# Patient Record
Sex: Female | Born: 1955 | Race: White | Hispanic: No | Marital: Single | Smoking: Never smoker
Health system: Southern US, Community
[De-identification: ages and names within clinical notes are randomized; demographics above are authoritative.]

## PROBLEM LIST (undated history)

## (undated) DIAGNOSIS — R5382 Chronic fatigue, unspecified: Secondary | ICD-10-CM

## (undated) DIAGNOSIS — G43019 Migraine without aura, intractable, without status migrainosus: Secondary | ICD-10-CM

## (undated) DIAGNOSIS — Z803 Family history of malignant neoplasm of breast: Secondary | ICD-10-CM

## (undated) DIAGNOSIS — I10 Essential (primary) hypertension: Secondary | ICD-10-CM

## (undated) DIAGNOSIS — G9332 Myalgic encephalomyelitis/chronic fatigue syndrome: Secondary | ICD-10-CM

## (undated) DIAGNOSIS — Z9889 Other specified postprocedural states: Secondary | ICD-10-CM

## (undated) DIAGNOSIS — M5430 Sciatica, unspecified side: Secondary | ICD-10-CM

## (undated) DIAGNOSIS — M810 Age-related osteoporosis without current pathological fracture: Secondary | ICD-10-CM

## (undated) DIAGNOSIS — K219 Gastro-esophageal reflux disease without esophagitis: Secondary | ICD-10-CM

## (undated) DIAGNOSIS — N281 Cyst of kidney, acquired: Secondary | ICD-10-CM

## (undated) DIAGNOSIS — F332 Major depressive disorder, recurrent severe without psychotic features: Secondary | ICD-10-CM

## (undated) DIAGNOSIS — G43909 Migraine, unspecified, not intractable, without status migrainosus: Secondary | ICD-10-CM

## (undated) DIAGNOSIS — F32A Depression, unspecified: Secondary | ICD-10-CM

## (undated) DIAGNOSIS — K5790 Diverticulosis of intestine, part unspecified, without perforation or abscess without bleeding: Secondary | ICD-10-CM

## (undated) DIAGNOSIS — C44310 Basal cell carcinoma of skin of unspecified parts of face: Secondary | ICD-10-CM

## (undated) DIAGNOSIS — F419 Anxiety disorder, unspecified: Secondary | ICD-10-CM

## (undated) DIAGNOSIS — K8020 Calculus of gallbladder without cholecystitis without obstruction: Secondary | ICD-10-CM

## (undated) DIAGNOSIS — M797 Fibromyalgia: Secondary | ICD-10-CM

## (undated) DIAGNOSIS — R319 Hematuria, unspecified: Secondary | ICD-10-CM

## (undated) DIAGNOSIS — R922 Inconclusive mammogram: Secondary | ICD-10-CM

## (undated) DIAGNOSIS — F411 Generalized anxiety disorder: Secondary | ICD-10-CM

## (undated) DIAGNOSIS — F329 Major depressive disorder, single episode, unspecified: Secondary | ICD-10-CM

## (undated) DIAGNOSIS — R000 Tachycardia, unspecified: Secondary | ICD-10-CM

## (undated) DIAGNOSIS — F5104 Psychophysiologic insomnia: Secondary | ICD-10-CM

## (undated) HISTORY — DX: Hematuria, unspecified: R31.9

## (undated) HISTORY — DX: Calculus of gallbladder without cholecystitis without obstruction: K80.20

## (undated) HISTORY — DX: Migraine without aura, intractable, without status migrainosus: G43.019

## (undated) HISTORY — DX: Basal cell carcinoma of skin of unspecified parts of face: C44.310

## (undated) HISTORY — DX: Tachycardia, unspecified: R00.0

## (undated) HISTORY — DX: Anxiety disorder, unspecified: F41.9

## (undated) HISTORY — DX: Myalgic encephalomyelitis/chronic fatigue syndrome: G93.32

## (undated) HISTORY — DX: Major depressive disorder, single episode, unspecified: F32.9

## (undated) HISTORY — DX: Chronic fatigue, unspecified: R53.82

## (undated) HISTORY — DX: Cyst of kidney, acquired: N28.1

## (undated) HISTORY — DX: Psychophysiologic insomnia: F51.04

## (undated) HISTORY — DX: Depression, unspecified: F32.A

## (undated) HISTORY — DX: Sciatica, unspecified side: M54.30

## (undated) HISTORY — DX: Migraine, unspecified, not intractable, without status migrainosus: G43.909

## (undated) HISTORY — DX: Fibromyalgia: M79.7

---
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
## 1998-02-28 ENCOUNTER — Other Ambulatory Visit: Admission: RE | Admit: 1998-02-28 | Discharge: 1998-02-28 | Payer: Self-pay | Admitting: Obstetrics and Gynecology

## 1999-03-01 ENCOUNTER — Encounter: Payer: Self-pay | Admitting: Family Medicine

## 1999-03-01 ENCOUNTER — Encounter: Admission: RE | Admit: 1999-03-01 | Discharge: 1999-03-01 | Payer: Self-pay | Admitting: Family Medicine

## 1999-05-03 ENCOUNTER — Other Ambulatory Visit: Admission: RE | Admit: 1999-05-03 | Discharge: 1999-05-03 | Payer: Self-pay | Admitting: Obstetrics and Gynecology

## 1999-08-10 ENCOUNTER — Encounter: Payer: Self-pay | Admitting: *Deleted

## 1999-08-10 ENCOUNTER — Encounter: Admission: RE | Admit: 1999-08-10 | Discharge: 1999-08-10 | Payer: Self-pay | Admitting: *Deleted

## 2000-06-10 ENCOUNTER — Other Ambulatory Visit: Admission: RE | Admit: 2000-06-10 | Discharge: 2000-06-10 | Payer: Self-pay | Admitting: Obstetrics and Gynecology

## 2000-09-12 ENCOUNTER — Encounter: Admission: RE | Admit: 2000-09-12 | Discharge: 2000-09-12 | Payer: Self-pay | Admitting: *Deleted

## 2000-09-12 ENCOUNTER — Encounter: Payer: Self-pay | Admitting: *Deleted

## 2000-09-17 ENCOUNTER — Encounter: Payer: Self-pay | Admitting: *Deleted

## 2000-09-17 ENCOUNTER — Encounter: Admission: RE | Admit: 2000-09-17 | Discharge: 2000-09-17 | Payer: Self-pay | Admitting: *Deleted

## 2001-06-11 ENCOUNTER — Other Ambulatory Visit: Admission: RE | Admit: 2001-06-11 | Discharge: 2001-06-11 | Payer: Self-pay | Admitting: *Deleted

## 2001-09-18 ENCOUNTER — Encounter: Admission: RE | Admit: 2001-09-18 | Discharge: 2001-09-18 | Payer: Self-pay | Admitting: *Deleted

## 2001-09-18 ENCOUNTER — Encounter: Payer: Self-pay | Admitting: *Deleted

## 2002-06-17 ENCOUNTER — Other Ambulatory Visit: Admission: RE | Admit: 2002-06-17 | Discharge: 2002-06-17 | Payer: Self-pay | Admitting: *Deleted

## 2002-09-21 ENCOUNTER — Encounter: Payer: Self-pay | Admitting: *Deleted

## 2002-09-21 ENCOUNTER — Encounter: Admission: RE | Admit: 2002-09-21 | Discharge: 2002-09-21 | Payer: Self-pay | Admitting: *Deleted

## 2003-05-15 ENCOUNTER — Encounter: Admission: RE | Admit: 2003-05-15 | Discharge: 2003-05-15 | Payer: Self-pay | Admitting: Orthopedic Surgery

## 2003-06-18 ENCOUNTER — Other Ambulatory Visit: Admission: RE | Admit: 2003-06-18 | Discharge: 2003-06-18 | Payer: Self-pay | Admitting: *Deleted

## 2003-06-18 ENCOUNTER — Encounter: Admission: RE | Admit: 2003-06-18 | Discharge: 2003-06-18 | Payer: Self-pay | Admitting: *Deleted

## 2003-07-31 ENCOUNTER — Encounter: Admission: RE | Admit: 2003-07-31 | Discharge: 2003-07-31 | Payer: Self-pay | Admitting: Neurology

## 2003-12-22 ENCOUNTER — Ambulatory Visit: Payer: Self-pay | Admitting: Internal Medicine

## 2004-06-20 ENCOUNTER — Other Ambulatory Visit: Admission: RE | Admit: 2004-06-20 | Discharge: 2004-06-20 | Payer: Self-pay | Admitting: *Deleted

## 2004-07-04 ENCOUNTER — Encounter: Admission: RE | Admit: 2004-07-04 | Discharge: 2004-07-04 | Payer: Self-pay | Admitting: *Deleted

## 2004-07-19 ENCOUNTER — Ambulatory Visit (HOSPITAL_COMMUNITY): Admission: RE | Admit: 2004-07-19 | Discharge: 2004-07-19 | Payer: Self-pay | Admitting: Family Medicine

## 2005-07-02 ENCOUNTER — Encounter: Admission: RE | Admit: 2005-07-02 | Discharge: 2005-07-02 | Payer: Self-pay | Admitting: Family Medicine

## 2005-07-05 ENCOUNTER — Encounter: Admission: RE | Admit: 2005-07-05 | Discharge: 2005-07-05 | Payer: Self-pay | Admitting: Obstetrics & Gynecology

## 2005-07-10 ENCOUNTER — Other Ambulatory Visit: Admission: RE | Admit: 2005-07-10 | Discharge: 2005-07-10 | Payer: Self-pay | Admitting: Obstetrics & Gynecology

## 2005-10-11 ENCOUNTER — Ambulatory Visit (HOSPITAL_COMMUNITY): Admission: RE | Admit: 2005-10-11 | Discharge: 2005-10-11 | Payer: Self-pay | Admitting: Obstetrics & Gynecology

## 2006-07-08 ENCOUNTER — Encounter: Admission: RE | Admit: 2006-07-08 | Discharge: 2006-07-08 | Payer: Self-pay | Admitting: Obstetrics & Gynecology

## 2006-07-26 ENCOUNTER — Other Ambulatory Visit: Admission: RE | Admit: 2006-07-26 | Discharge: 2006-07-26 | Payer: Self-pay | Admitting: Obstetrics & Gynecology

## 2006-12-02 HISTORY — PX: MOHS SURGERY: SUR867

## 2007-07-10 ENCOUNTER — Encounter: Admission: RE | Admit: 2007-07-10 | Discharge: 2007-07-10 | Payer: Self-pay | Admitting: Obstetrics & Gynecology

## 2007-07-31 ENCOUNTER — Other Ambulatory Visit: Admission: RE | Admit: 2007-07-31 | Discharge: 2007-07-31 | Payer: Self-pay | Admitting: Obstetrics & Gynecology

## 2008-08-05 ENCOUNTER — Other Ambulatory Visit: Admission: RE | Admit: 2008-08-05 | Discharge: 2008-08-05 | Payer: Self-pay | Admitting: Obstetrics & Gynecology

## 2009-06-22 ENCOUNTER — Encounter: Admission: RE | Admit: 2009-06-22 | Discharge: 2009-06-22 | Payer: Self-pay | Admitting: Family Medicine

## 2010-04-23 ENCOUNTER — Encounter: Payer: Self-pay | Admitting: Obstetrics & Gynecology

## 2011-06-07 ENCOUNTER — Other Ambulatory Visit: Payer: Self-pay | Admitting: Internal Medicine

## 2011-06-07 DIAGNOSIS — K921 Melena: Secondary | ICD-10-CM

## 2011-06-08 ENCOUNTER — Ambulatory Visit
Admission: RE | Admit: 2011-06-08 | Discharge: 2011-06-08 | Disposition: A | Discharge status: Home / Self Care | Payer: Medicare Other | Source: Ambulatory Visit | Attending: Internal Medicine | Admitting: Internal Medicine

## 2011-06-08 DIAGNOSIS — K921 Melena: Secondary | ICD-10-CM

## 2011-06-08 MED ORDER — IOHEXOL 300 MG/ML  SOLN
100.0000 mL | Freq: Once | INTRAMUSCULAR | Status: AC | PRN
  Administered 2011-06-08: 100 mL via INTRAVENOUS

## 2011-08-22 LAB — HM COLONOSCOPY

## 2011-09-04 LAB — HM PAP SMEAR: HM Pap smear: NEGATIVE

## 2012-01-29 LAB — HM DEXA SCAN

## 2012-07-30 ENCOUNTER — Telehealth: Payer: Self-pay | Admitting: *Deleted

## 2012-07-30 NOTE — Telephone Encounter (Signed)
Please sign order form for patient from [facility] for [place]ia[place]. Mommogram . Chart in your cabinet. sue

## 2012-08-15 ENCOUNTER — Telehealth: Payer: Self-pay | Admitting: *Deleted

## 2012-08-15 NOTE — Telephone Encounter (Signed)
SEE CHART IN YOUR OFFICE. MAMMOGRAM REPORT FOR 08/06/2012

## 2012-08-18 NOTE — Telephone Encounter (Signed)
Chart out of hold per Dr. Hyacinth Meeker. 04/ 35month recall

## 2012-09-26 ENCOUNTER — Encounter: Payer: Self-pay | Admitting: Obstetrics & Gynecology

## 2012-10-01 ENCOUNTER — Telehealth: Payer: Self-pay | Admitting: *Deleted

## 2012-10-01 ENCOUNTER — Ambulatory Visit (INDEPENDENT_AMBULATORY_CARE_PROVIDER_SITE_OTHER): Payer: Medicare Other | Admitting: Obstetrics & Gynecology

## 2012-10-01 ENCOUNTER — Ambulatory Visit: Payer: Self-pay | Admitting: Obstetrics & Gynecology

## 2012-10-01 ENCOUNTER — Encounter: Payer: Self-pay | Admitting: Obstetrics & Gynecology

## 2012-10-01 VITALS — BP 102/68 | HR 68 | Resp 16 | Ht 64.75 in | Wt 176.0 lb

## 2012-10-01 DIAGNOSIS — Z124 Encounter for screening for malignant neoplasm of cervix: Secondary | ICD-10-CM

## 2012-10-01 DIAGNOSIS — Z01419 Encounter for gynecological examination (general) (routine) without abnormal findings: Secondary | ICD-10-CM

## 2012-10-01 DIAGNOSIS — N9489 Other specified conditions associated with female genital organs and menstrual cycle: Secondary | ICD-10-CM

## 2012-10-01 DIAGNOSIS — Z0000 Encounter for general adult medical examination without abnormal findings: Secondary | ICD-10-CM

## 2012-10-01 LAB — COMPREHENSIVE METABOLIC PANEL
ALT: 15 U/L (ref 0–35)
AST: 23 U/L (ref 0–37)
Albumin: 5 g/dL (ref 3.5–5.2)
Alkaline Phosphatase: 58 U/L (ref 39–117)
BUN: 22 mg/dL (ref 6–23)
CO2: 25 mEq/L (ref 19–32)
Calcium: 9.8 mg/dL (ref 8.4–10.5)
Chloride: 99 mEq/L (ref 96–112)
Creat: 1.42 mg/dL — ABNORMAL HIGH (ref 0.50–1.10)
Glucose, Bld: 59 mg/dL — ABNORMAL LOW (ref 70–99)
Potassium: 4.4 mEq/L (ref 3.5–5.3)
Sodium: 137 mEq/L (ref 135–145)
Total Bilirubin: 0.4 mg/dL (ref 0.3–1.2)
Total Protein: 7.8 g/dL (ref 6.0–8.3)

## 2012-10-01 LAB — POCT URINALYSIS DIPSTICK
Bilirubin, UA: NEGATIVE
Blood, UA: NEGATIVE
Glucose, UA: NEGATIVE
Ketones, UA: NEGATIVE
Leukocytes, UA: NEGATIVE
Nitrite, UA: NEGATIVE
Urobilinogen, UA: NEGATIVE
pH, UA: 5

## 2012-10-01 LAB — LIPID PANEL
Cholesterol: 249 mg/dL — ABNORMAL HIGH (ref 0–200)
HDL: 79 mg/dL (ref >39)
LDL Cholesterol: 153 mg/dL — ABNORMAL HIGH (ref 0–99)
Total CHOL/HDL Ratio: 3.2 Ratio
Triglycerides: 86 mg/dL (ref <150)
VLDL: 17 mg/dL (ref 0–40)

## 2012-10-01 NOTE — Progress Notes (Signed)
57 y.o. G0P0 SingleCaucasianF here for annual exam.  No vaginal bleeding.  Struggling with depression.  Has noted some rectal bleeding.  She did have a colonoscopy last year with Dr. Loreta Ave.  Had tubular adenoma.  Will have follow up this year.  Has noticed a lot of pelvic cramping--like before her cycle would start.  She is feels like her period could start but she hasn't had any bleeding.     Patient's last menstrual period was 12/31/2008.          Sexually active: no  The current method of family planning is post menopausal status.    Exercising: yes  just started again with trainer Smoker:  no  Health Maintenance: Pap:  09/04/11 WNL/negative HR HPV History of abnormal Pap:  no MMG:  08/06/12, saw 8mm lymph node.  Has 6 month recall Colonoscopy:  2013 will see Dr Loreta Ave this year BMD:   2013 TDaP:  02/15/11 Screening Labs: here today, Hb today: today, Urine today: PROTEIN-trace   reports that she has never smoked. She has never used smokeless tobacco. She reports that she does not drink alcohol or use illicit drugs.  Past Medical History  Diagnosis Date  . Anxiety   . Depression   . Renal cyst   . CFS (chronic fatigue syndrome)   . Cancer     basal cell forehead  . Tachycardia     negative stress test  . Hematuria   . Sciatica     bulging L5-S1    Past Surgical History  Procedure Laterality Date  . Mohs surgery  9/08    Dr Deanna Artis    Current Outpatient Prescriptions  Medication Sig Dispense Refill  . aspirin EC 81 MG tablet Take 81 mg by mouth daily.      . Bisoprolol Fumarate (ZEBETA PO) Take by mouth.      Marland Kitchen buPROPion (WELLBUTRIN XL) 150 MG 24 hr tablet Take 150 mg by mouth daily.      . Calcium Carb-Cholecalciferol (CALCIUM 1000 + D PO) Take by mouth.      . clonazePAM (KLONOPIN) 1 MG tablet Take 1-2 mg by mouth 2 (two) times daily as needed for anxiety.      . Coenzyme Q10 (CO Q 10 PO) Take by mouth.      . Methylphenidate HCl (RITALIN PO) Take 7.5 mg by mouth daily.       . NYSTATIN PO Take by mouth. Oral rinse      . nystatin-triamcinolone (MYCOLOG II) cream Apply topically 4 (four) times daily. For fungal infection in mouth      . triazolam (HALCION) 0.25 MG tablet Take 0.5 mg by mouth at bedtime as needed.      . Vortioxetine HBr (BRINTELLIX) 5 MG TABS Take by mouth daily.       No current facility-administered medications for this visit.    Family History  Problem Relation Age of Onset  . Breast cancer Mother   . Hypertension Mother   . Heart disease Maternal Grandmother   . Heart disease Paternal Grandmother   . Heart Problems Mother     pacemaker placed  . Cancer Father     2013 started in the bowel, mets to liver    ROS:  Pertinent items are noted in HPI.  Otherwise, a comprehensive ROS was negative.  Exam:   BP 102/68  Pulse 68  Resp 16  Ht 5' 4.75" (1.645 m)  Wt 176 lb (79.833 kg)  BMI 29.5 kg/m2  LMP 12/31/2008    Height: 5' 4.75" (164.5 cm)  Ht Readings from Last 3 Encounters:  10/01/12 5' 4.75" (1.645 m)    General appearance: alert, cooperative and appears stated age Head: Normocephalic, without obvious abnormality, atraumatic Neck: no adenopathy, supple, symmetrical, trachea midline and thyroid normal to inspection and palpation Lungs: clear to auscultation bilaterally Breasts: normal appearance, no masses or tenderness Heart: regular rate and rhythm Abdomen: soft, non-tender; bowel sounds normal; no masses,  no organomegaly Extremities: extremities normal, atraumatic, no cyanosis or edema Skin: Skin color, texture, turgor normal. No rashes or lesions Lymph nodes: Cervical, supraclavicular, and axillary nodes normal. No abnormal inguinal nodes palpated Neurologic: Grossly normal   Pelvic: External genitalia:  no lesions              Urethra:  normal appearing urethra with no masses, tenderness or lesions              Bartholins and Skenes: normal                 Vagina: normal appearing vagina with normal color and  discharge, no lesions              Cervix: no lesions              Pap taken: yes Bimanual Exam:  Uterus:  normal size, contour, position, consistency, mobility, non-tender              Adnexa: normal adnexa and no mass, fullness, tenderness               Rectovaginal: Confirms               Anus:  normal sphincter tone, no lesions  A:  Well Woman with normal exam Long history of depression, H/O ECT, has been on multiple medications over the years for this Family hx of breast cancer in mother Dense breasts  P:   Mammogram yearly.  Currently on 6 month recall for 8mm lymph node pap smear only today CMP, FLP, TSH, Vit D Vaginal ultrasound Recheck breasts 6 months.  AEX 1 year.   An After Visit Summary was printed and given to the patient.

## 2012-10-01 NOTE — Telephone Encounter (Signed)
Returning call to Allison Cruz

## 2012-10-01 NOTE — Patient Instructions (Signed)

## 2012-10-01 NOTE — Telephone Encounter (Signed)
PUS appt scheduled for next Wednesday 10-08-12 at 1130.  Call to patient to notify. LMTCB.

## 2012-10-02 LAB — IPS PAP SMEAR ONLY

## 2012-10-02 LAB — VITAMIN D 25 HYDROXY (VIT D DEFICIENCY, FRACTURES): Vit D, 25-Hydroxy: 44 ng/mL (ref 30–89)

## 2012-10-02 LAB — TSH: TSH: 2.92 u[IU]/mL (ref 0.350–4.500)

## 2012-10-02 NOTE — Telephone Encounter (Signed)
Spoke with patient, just wanted to let Dr Hyacinth Meeker know after her pap smear yesterday did have some bleeding with wiping x2 and lower abd cramping. Is better today, but that has never happened before. Aware to call if worsens and to keep appointment for PUS next week. If any other suggestions will call her back.

## 2012-10-03 ENCOUNTER — Ambulatory Visit: Payer: Self-pay | Admitting: Obstetrics & Gynecology

## 2012-10-06 ENCOUNTER — Telehealth: Payer: Self-pay

## 2012-10-06 NOTE — Telephone Encounter (Signed)
Patient notified of all results. 

## 2012-10-06 NOTE — Telephone Encounter (Signed)
7/7 [email or web address]//[id number] 

## 2012-10-06 NOTE — Telephone Encounter (Signed)
Message copied by Elisha Headland on Mon Oct 06, 2012 10:24 AM ------      Message from: Jerene Bears      Created: Thu Oct 02, 2012  2:21 PM       Please call.  TSH nl.  Vit D normal.  Cholesterol elevated with LDL 153.  HDLs and TGs good.  Repeat 1 year, fasting.  CMP showed one kidney test mildly elevated.  I would like to repeat when she comes back for her ultrasound.  This is non fasting test.  Could be abnormal if was a little dehydrated when she had blood work drawn.  Try and get at least 64 oz water daily for several days before ultrasound.  Pap was normal. ------

## 2012-10-08 ENCOUNTER — Ambulatory Visit (INDEPENDENT_AMBULATORY_CARE_PROVIDER_SITE_OTHER): Payer: Medicare Other | Admitting: Obstetrics & Gynecology

## 2012-10-08 ENCOUNTER — Ambulatory Visit (INDEPENDENT_AMBULATORY_CARE_PROVIDER_SITE_OTHER): Payer: Medicare Other

## 2012-10-08 DIAGNOSIS — N9489 Other specified conditions associated with female genital organs and menstrual cycle: Secondary | ICD-10-CM

## 2012-10-08 DIAGNOSIS — R6889 Other general symptoms and signs: Secondary | ICD-10-CM

## 2012-10-08 DIAGNOSIS — N898 Other specified noninflammatory disorders of vagina: Secondary | ICD-10-CM

## 2012-10-08 DIAGNOSIS — R899 Unspecified abnormal finding in specimens from other organs, systems and tissues: Secondary | ICD-10-CM

## 2012-10-08 NOTE — Progress Notes (Signed)
57 y.o.Singlefemale here for a pelvic ultrasound.  Patient has a lot of anxiety about cancers and has been feeling increased cramping, bloating and pelvic pain.  Desirous of PUS.  Patient's last menstrual period was 12/31/2008.  Sexually active:  no  Contraception: PMP  FINDINGS: UTERUS: 4.8 x 2.3 x 1.8cm EMS: 1.33mm ADNEXA:   Left ovary  2.0 x 1.2 x 0.7cm, atrophic   Right ovary 2.2 x 1.2 x 1.1cm, atrophic CUL DE SAC: neg  Images reviewed personally with patient.  All questions answered.  Assessment:  Pelvic pain/bloating/cramping Plan: Normal postmenopausal ultrasound.  Pt reassured.   BMP today due to abnormal Creatinine at AEX labs  ~15 minutes spent with patient >50% of time was in face to face discussion of above.

## 2012-10-09 ENCOUNTER — Encounter: Payer: Self-pay | Admitting: Obstetrics & Gynecology

## 2012-10-09 LAB — BASIC METABOLIC PANEL
BUN: 17 mg/dL (ref 6–23)
CO2: 26 mEq/L (ref 19–32)
Calcium: 9.2 mg/dL (ref 8.4–10.5)
Chloride: 100 mEq/L (ref 96–112)
Creat: 1.08 mg/dL (ref 0.50–1.10)
Glucose, Bld: 91 mg/dL (ref 70–99)
Potassium: 4.5 mEq/L (ref 3.5–5.3)
Sodium: 135 mEq/L (ref 135–145)

## 2012-10-09 NOTE — Patient Instructions (Signed)
Call with any new problems.

## 2013-03-11 ENCOUNTER — Encounter: Payer: Self-pay | Admitting: Obstetrics & Gynecology

## 2013-04-07 ENCOUNTER — Ambulatory Visit (INDEPENDENT_AMBULATORY_CARE_PROVIDER_SITE_OTHER): Payer: BC Managed Care – PPO | Admitting: Obstetrics & Gynecology

## 2013-04-07 VITALS — BP 100/64 | HR 60 | Resp 16 | Ht 64.75 in | Wt 187.0 lb

## 2013-04-07 DIAGNOSIS — N6019 Diffuse cystic mastopathy of unspecified breast: Secondary | ICD-10-CM

## 2013-04-07 DIAGNOSIS — R922 Inconclusive mammogram: Secondary | ICD-10-CM

## 2013-04-07 DIAGNOSIS — F329 Major depressive disorder, single episode, unspecified: Secondary | ICD-10-CM

## 2013-04-07 DIAGNOSIS — F3289 Other specified depressive episodes: Secondary | ICD-10-CM

## 2013-04-07 DIAGNOSIS — Z803 Family history of malignant neoplasm of breast: Secondary | ICD-10-CM

## 2013-04-07 DIAGNOSIS — F32A Depression, unspecified: Secondary | ICD-10-CM

## 2013-04-07 DIAGNOSIS — R9230 Dense breasts, unspecified: Secondary | ICD-10-CM

## 2013-04-07 NOTE — Progress Notes (Signed)
   Subjective:   58 y.o. SingleCaucasian female presents for evaluation of bilateral breasts.  H/O breast cancer in her mother.  Pt has very dense breasts.  Had diagnostic MMG and ultrasound 11/14.  Reviewed with patient.  This was normal.  Scheduled for follow up diagnostic in one year.  She already has an appointmetn for this.  Her current issue/concern is that the right breast is so very tender.  Reports she drinks "too much" caffeine.    Review of Systems ROS:  All systems negative except as per HPI.   Objective:   General appearance: alert and cooperative Head: Normocephalic, without obvious abnormality, atraumatic Neck: no adenopathy, supple, symmetrical, trachea midline and thyroid not enlarged, symmetric, no tenderness/mass/nodules Breasts: normal appearance, no masses or tenderness   Assessment:   ASSESSMENT: Dense, fibrocystic breast changes Family hx of breast cancer in mother H/O depression  Plan:   Repeat physical exam in 6 months for AEX Has diagnostic MMG scheduled for 1 year from prior.  Pt knows to call with any new issues/problems.

## 2013-04-08 ENCOUNTER — Encounter: Payer: Self-pay | Admitting: Obstetrics & Gynecology

## 2013-04-08 DIAGNOSIS — F332 Major depressive disorder, recurrent severe without psychotic features: Secondary | ICD-10-CM

## 2013-04-08 HISTORY — DX: Major depressive disorder, recurrent severe without psychotic features: F33.2

## 2013-06-01 ENCOUNTER — Other Ambulatory Visit: Payer: Self-pay | Admitting: Internal Medicine

## 2013-06-01 DIAGNOSIS — R1310 Dysphagia, unspecified: Secondary | ICD-10-CM

## 2013-06-04 ENCOUNTER — Ambulatory Visit
Admission: RE | Admit: 2013-06-04 | Discharge: 2013-06-04 | Disposition: A | Discharge status: Home / Self Care | Payer: Medicare Other | Source: Ambulatory Visit | Attending: Internal Medicine | Admitting: Internal Medicine

## 2013-06-04 DIAGNOSIS — R1310 Dysphagia, unspecified: Secondary | ICD-10-CM

## 2013-08-12 ENCOUNTER — Encounter (HOSPITAL_COMMUNITY): Payer: Self-pay | Admitting: Psychiatry

## 2013-08-12 ENCOUNTER — Ambulatory Visit (INDEPENDENT_AMBULATORY_CARE_PROVIDER_SITE_OTHER): Payer: Medicare Other | Admitting: Psychiatry

## 2013-08-12 VITALS — BP 112/74 | HR 76 | Ht 65.0 in | Wt 191.2 lb

## 2013-08-12 DIAGNOSIS — F332 Major depressive disorder, recurrent severe without psychotic features: Secondary | ICD-10-CM
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
## 2013-09-17 ENCOUNTER — Telehealth (HOSPITAL_COMMUNITY): Payer: Self-pay | Admitting: *Deleted

## 2013-09-17 NOTE — Telephone Encounter (Signed)
Writer contacted pt to schedule initial [place] appointment. Writer asked pt if she could come in on 10/05/13 at 1:15. Pt stated she has another appointment at that time that she is unwilling to reschedule. Pt stated she is very disappointed that it is taking so long to get her in for [place] treatment. Pt stated she is considering seeking [place] treatment from a clinic in [place] in lieu of having [place] done at this clinic. Writer assured pt that he would work with MD to determine a time when pt could be started on [place]. Pt reported that she is desperate as her depression is worsening. Writer inquired if pt was experiencing SI. Pt stated "when you're this depressed, it's always in the back of your mind, but I'm not going to hurt myself." Writer clarified that pt has no plan or intent to harm herself. Pt contracted with Probation officer for safety.Writer instructed pt to call 911 or report to nearest ED if she begins to experience active/increased SI. Pt said she would.

## 2013-10-05 ENCOUNTER — Ambulatory Visit: Payer: Medicare Other | Admitting: Obstetrics & Gynecology

## 2013-10-08 ENCOUNTER — Ambulatory Visit (INDEPENDENT_AMBULATORY_CARE_PROVIDER_SITE_OTHER): Payer: Medicare Other | Admitting: Psychiatry

## 2013-10-08 VITALS — BP 100/62 | HR 66 | Ht 65.0 in | Wt 187.4 lb

## 2013-10-08 DIAGNOSIS — Z79899 Other long term (current) drug therapy: Secondary | ICD-10-CM | POA: Diagnosis not present

## 2013-10-08 DIAGNOSIS — Z598 Other problems related to housing and economic circumstances: Secondary | ICD-10-CM | POA: Insufficient documentation

## 2013-10-08 DIAGNOSIS — M5430 Sciatica, unspecified side: Secondary | ICD-10-CM | POA: Insufficient documentation

## 2013-10-08 DIAGNOSIS — F411 Generalized anxiety disorder: Secondary | ICD-10-CM | POA: Diagnosis not present

## 2013-10-08 DIAGNOSIS — Z5987 Material hardship due to limited financial resources, not elsewhere classified: Secondary | ICD-10-CM | POA: Insufficient documentation

## 2013-10-08 DIAGNOSIS — G9332 Myalgic encephalomyelitis/chronic fatigue syndrome: Secondary | ICD-10-CM | POA: Diagnosis not present

## 2013-10-08 DIAGNOSIS — G4700 Insomnia, unspecified: Secondary | ICD-10-CM | POA: Diagnosis not present

## 2013-10-08 DIAGNOSIS — F332 Major depressive disorder, recurrent severe without psychotic features: Secondary | ICD-10-CM

## 2013-10-08 DIAGNOSIS — R5382 Chronic fatigue, unspecified: Secondary | ICD-10-CM | POA: Insufficient documentation

## 2013-10-08 DIAGNOSIS — Z7982 Long term (current) use of aspirin: Secondary | ICD-10-CM | POA: Diagnosis not present

## 2013-10-08 NOTE — Progress Notes (Signed)
Patient ID: Allison Cruz, female   DOB: 11-23-1955, 58 y.o.   MRN: [id number] Pt reported to [facility] for cortical mapping for Repetitve Transcranial Magnetic Stimulation treatment for Major Depressive Disorder. Pt also received her first rTMS tx. Pt rated a PHQ-9 score of 23 (severe depression) and a Beck's Depression Inventory score of 47 (extreme depression). Pt's tx parameters were calculated as follows: Tx AP -- 14.8 cm, SOA -- 30 degrees, Coil Angle -- 0 degrees, Dose -- 1.19 SMT. After cortical mapping, pt tolerated tx well. Pt stated that she experienced mild to moderate discomfort in the tx area during pulses, but she said the discomfort was tolerable. %MT titrated up to 100%. Will attempt to titrate up to 120% at the next tx session.

## 2013-10-09 ENCOUNTER — Other Ambulatory Visit (HOSPITAL_COMMUNITY): Payer: Medicare Other | Attending: Psychiatry | Admitting: *Deleted

## 2013-10-09 VITALS — BP 106/69 | HR 75

## 2013-10-09 DIAGNOSIS — F332 Major depressive disorder, recurrent severe without psychotic features: Secondary | ICD-10-CM

## 2013-10-09 NOTE — Progress Notes (Signed)
Patient ID: Allison Cruz, female   DOB: 1955/09/02, 58 y.o.   MRN: [id number] Pt reported to [facility] for Repetitve Transcranial Magnetic Stimulation treatment for Major Depressive Disorder. Pt reported that she felt tired yesterday after her treatment session. Pt reported that she took Tylenol proactively before treatment today to mitigate discomfort in the treatment area. Pt tolerated tx well. As pt endorsed significant levels of discomfort in the tx area, %MT titrated up to 115% rather than 120%. Will titrate up to 120% at next tx session.

## 2013-10-12 ENCOUNTER — Encounter: Payer: Self-pay | Admitting: Obstetrics & Gynecology

## 2013-10-12 ENCOUNTER — Ambulatory Visit (INDEPENDENT_AMBULATORY_CARE_PROVIDER_SITE_OTHER): Payer: Medicare Other | Admitting: Obstetrics & Gynecology

## 2013-10-12 ENCOUNTER — Other Ambulatory Visit (HOSPITAL_COMMUNITY): Payer: Medicare Other | Attending: Psychiatry | Admitting: *Deleted

## 2013-10-12 VITALS — BP 100/62 | HR 60 | Resp 16 | Ht 64.5 in | Wt 187.4 lb

## 2013-10-12 VITALS — BP 97/71 | HR 74

## 2013-10-12 DIAGNOSIS — Z124 Encounter for screening for malignant neoplasm of cervix: Secondary | ICD-10-CM

## 2013-10-12 DIAGNOSIS — F332 Major depressive disorder, recurrent severe without psychotic features: Secondary | ICD-10-CM

## 2013-10-12 DIAGNOSIS — Z01419 Encounter for gynecological examination (general) (routine) without abnormal findings: Secondary | ICD-10-CM

## 2013-10-12 DIAGNOSIS — Z0000 Encounter for general adult medical examination without abnormal findings: Secondary | ICD-10-CM

## 2013-10-12 LAB — POCT URINALYSIS DIPSTICK
Bilirubin, UA: NEGATIVE
Blood, UA: NEGATIVE
Glucose, UA: NEGATIVE
Ketones, UA: NEGATIVE
Nitrite, UA: NEGATIVE
Urobilinogen, UA: NEGATIVE
pH, UA: 5

## 2013-10-12 NOTE — Progress Notes (Signed)
Patient ID: Allison Cruz, female   DOB: 1956/02/18, 58 y.o.   MRN: [id number] Pt reported to [facility] for Repetitve Transcranial Magnetic Stimulation treatment for Major Depressive Disorder. Pt reported that she had an "aweful" weekend: "I didn't leave the couch all weekend, and if I didn't have to be here for this, I would probably still be there." Pt shared that she suffers from extreme isolation and social anxiety. Pt tolerated tx well. %MT titrated up to 120% where it remained for the duration of tx.

## 2013-10-12 NOTE — Progress Notes (Signed)
58 y.o. G0P0 SingleCaucasianF here for annual exam.  No vaginal bleeding.  Mother doing well.  Step father died in May 26, 2022.  Had chronic medical issues.  Saw Dr. Minna Antis around six months ago.  Blood work was normal.  Was told she wasn't pre-diabetic anymore.    Having TMS (transcranial magnetic stimulation) at [facility] for depression.  Pt thinks it is helping some.     Patient's last menstrual period was 12/31/2008.          Sexually active: No.  The current method of family planning is post menopausal status.    Exercising: No.  not regularly Smoker:  no  Health Maintenance: Pap:  10/01/12 WNL History of abnormal Pap:  no MMG:  08/07/13 3D-normal Colonoscopy:  2013-repeat in 5 years, f/u 10 years, Dr. Collene Mares BMD:   2014 TDaP:  2012 Screening Labs: PCP, Hb today: PCP, Urine today: trace leuk   reports that she has never smoked. She has never used smokeless tobacco. She reports that she does not drink alcohol or use illicit drugs.  Past Medical History  Diagnosis Date  . Anxiety   . Depression   . Renal cyst   . CFS (chronic fatigue syndrome)   . Basal cell carcinoma of face     forehead  . Tachycardia     negative stress test  . Hematuria     neg eval Dr. Joelyn Oms, 2000  . Sciatica     bulging L5-S1    Past Surgical History  Procedure Laterality Date  . Mohs surgery  9/08    Dr Eula Fried    Current Outpatient Prescriptions  Medication Sig Dispense Refill  . aspirin EC 81 MG tablet Take 81 mg by mouth daily.      . B Complex Vitamins (VITAMIN B COMPLEX PO) Take by mouth daily.      . Bisoprolol Fumarate (ZEBETA PO) Take by mouth.      Marland Kitchen BRINTELLIX 20 MG TABS       . buPROPion (WELLBUTRIN) 75 MG tablet Take 75 mg by mouth. Taking 1/2 tablet daily      . Calcium Carb-Cholecalciferol (CALCIUM 1000 + D PO) Take by mouth.      . clonazePAM (KLONOPIN) 1 MG tablet Take 1-2 mg by mouth 2 (two) times daily as needed for anxiety.      . Coenzyme Q10 (CO Q 10 PO) Take by mouth.      . fish  oil-omega-3 fatty acids 1000 MG capsule Take 2 g by mouth daily.      . promethazine (PHENERGAN) 25 MG tablet       . rizatriptan (MAXALT) 10 MG tablet as needed.      Marland Kitchen SAPHRIS 5 MG SUBL 24 hr tablet       . Vitamin D, Ergocalciferol, (DRISDOL) 50000 UNITS CAPS capsule Every other week      . zolpidem (AMBIEN) 10 MG tablet 10 mg daily.      . NYSTATIN PO Take by mouth. Oral rinse      . nystatin-triamcinolone (MYCOLOG II) cream Apply topically 4 (four) times daily. For fungal infection in mouth       No current facility-administered medications for this visit.    Family History  Problem Relation Age of Onset  . Breast cancer Mother 21    her 2 neu+  . Hypertension Mother   . Heart disease Maternal Grandmother   . Heart disease Paternal Grandmother   . Heart Problems Mother  pacemaker placed  . Colon cancer Father 85       . Breast cancer Maternal Aunt     maternal great aunt    ROS:  Pertinent items are noted in HPI.  Otherwise, a comprehensive ROS was negative.  Exam:   BP 100/62  Pulse 60  Resp 16  Ht 5' 4.5" (1.638 m)  Wt 187 lb 6.4 oz (85.004 kg)  BMI 31.68 kg/m2  LMP 12/31/2008  Weight change: +11#   Height: 5' 4.5" (163.8 cm)  Ht Readings from Last 3 Encounters:  10/12/13 5' 4.5" (1.638 m)  10/08/13 5\' 5"  (1.651 m)  08/12/13 5\' 5"  (1.651 m)    General appearance: alert, cooperative and appears stated age Head: Normocephalic, without obvious abnormality, atraumatic Neck: no adenopathy, supple, symmetrical, trachea midline and thyroid normal to inspection and palpation Lungs: clear to auscultation bilaterally Breasts: normal appearance, no masses or tenderness Heart: regular rate and rhythm Abdomen: soft, non-tender; bowel sounds normal; no masses,  no organomegaly Extremities: extremities normal, atraumatic, no cyanosis or edema Skin: Skin color, texture, turgor normal. No rashes or lesions Lymph nodes: Cervical, supraclavicular, and axillary nodes  normal. No abnormal inguinal nodes palpated Neurologic: Grossly normal   Pelvic: External genitalia:  no lesions              Urethra:  normal appearing urethra with no masses, tenderness or lesions              Bartholins and Skenes: normal                 Vagina: normal appearing vagina with normal color and discharge, no lesions              Cervix: no lesions              Pap taken: No. Bimanual Exam:  Uterus:  normal size, contour, position, consistency, mobility, non-tender              Adnexa: normal adnexa and no mass, fullness, tenderness               Rectovaginal: Confirms               Anus:  normal sphincter tone, no lesions  A:  Well Woman with normal exam  Long history of depression, H/O ECT, has been on multiple medications over the years for this  Family hx of breast cancer in mother  Dense breasts   P: Mammogram yearly. Comes for repeat breast exam every six months.   pap smear only today  Recheck breasts 6 months. AEX 1 year.  An After Visit Summary was printed and given to the patient.

## 2013-10-13 ENCOUNTER — Other Ambulatory Visit (HOSPITAL_COMMUNITY): Payer: Medicare Other | Attending: Psychiatry | Admitting: *Deleted

## 2013-10-13 VITALS — BP 101/67 | HR 74

## 2013-10-13 DIAGNOSIS — F332 Major depressive disorder, recurrent severe without psychotic features: Secondary | ICD-10-CM | POA: Diagnosis not present

## 2013-10-13 NOTE — Progress Notes (Signed)
Patient ID: Allison Cruz, female   DOB: October 11, 1955, 58 y.o.   MRN: [id number] Pt reported to [facility] for Repetitve Transcranial Magnetic Stimulation treatment for Major Depressive Disorder. Pt reported no significant changes from the previous day. During tx session, pt expressed desire to receive psychotherapy in conjunction with [place]. Pt is especially interested in EMDR therapy. Writer provided pt with brochure for practice in the area that offers EMDR and accepts pt's insurance. Pt tolerated tx well. %MT remained at 120% for the duration of tx.

## 2013-10-14 ENCOUNTER — Other Ambulatory Visit (HOSPITAL_COMMUNITY): Payer: Medicare Other | Attending: Psychiatry | Admitting: *Deleted

## 2013-10-14 VITALS — BP 106/60 | HR 75 | Ht 65.0 in | Wt 188.6 lb

## 2013-10-14 DIAGNOSIS — F332 Major depressive disorder, recurrent severe without psychotic features: Secondary | ICD-10-CM | POA: Diagnosis not present

## 2013-10-14 NOTE — Progress Notes (Signed)
Patient ID: Allison Cruz, female   DOB: 1955-10-28, 58 y.o.   MRN: [id number] Pt reported to [facility] for Repetitve Transcranial Magnetic Stimulation treatment for Major Depressive Disorder. Pt completed a PHQ-9 with a score of 22 (severe depression). Pt stated that she made an appointment with a psychologist at [facility] for psychotherapy later this month. Pt stated she is still interested in pursuing EMDR therapy, as well. Writer will look into local EMDR providers for pt. Pt tolerated tx well. %MT remained at 120% for the duration of tx.

## 2013-10-15 ENCOUNTER — Other Ambulatory Visit (HOSPITAL_COMMUNITY): Payer: Medicare Other | Attending: Psychiatry | Admitting: *Deleted

## 2013-10-15 VITALS — BP 117/70 | HR 73

## 2013-10-15 DIAGNOSIS — F332 Major depressive disorder, recurrent severe without psychotic features: Secondary | ICD-10-CM

## 2013-10-15 LAB — IPS PAP SMEAR ONLY

## 2013-10-15 NOTE — Progress Notes (Addendum)
Patient ID: Allison Cruz, female   DOB: Dec 20, 1955, 58 y.o.   MRN: [id number] Pt reported to [facility] for Repetitve Transcranial Magnetic Stimulation treatment for Major Depressive Disorder. Pt reported that she noticed the weather felt pleasant this morning, which she normally would not have noticed due to the severity of her depression. Writer noted that this instance could be due to symptomatic relief from [place]. Pt appears to be showing signs of progress. Pt tolerated tx well. Pt reported feeling an annoying level of twitching in the musculature of her forehead on the left side. Pt reported she did not experience this in a course of TMS tx she had in the past. Coil angle increased to +15 degrees (in 5 degree increments) to address pt's discomfort. After adjustment, pt reported that tx was much more tolerable. %MT remained at 120% for the duration of tx.

## 2013-10-16 ENCOUNTER — Other Ambulatory Visit (HOSPITAL_COMMUNITY): Payer: Medicare Other | Attending: Psychiatry | Admitting: *Deleted

## 2013-10-16 VITALS — BP 97/64 | HR 75

## 2013-10-16 DIAGNOSIS — F332 Major depressive disorder, recurrent severe without psychotic features: Secondary | ICD-10-CM

## 2013-10-16 NOTE — Progress Notes (Signed)
Patient ID: Allison Cruz, female   DOB: 03/09/1956, 58 y.o.   MRN: [id number] Pt reported to [facility] for Repetitve Transcranial Magnetic Stimulation treatment for Major Depressive Disorder. Pt stated that her cousin is coming to visit her for the weekend, which she is looking forward to. Writer and pt discussed the significance of this, as having something to look forward to, along with decreased social isolation, can serve as a buffer against the pt's depressive symptoms increasing over the weekend. Pt tolerated tx well. %MT remained at 120% for the duration of tx.

## 2013-10-19 ENCOUNTER — Other Ambulatory Visit (HOSPITAL_COMMUNITY): Payer: Medicare Other | Attending: Psychiatry | Admitting: *Deleted

## 2013-10-19 VITALS — BP 116/76 | HR 70

## 2013-10-19 DIAGNOSIS — F332 Major depressive disorder, recurrent severe without psychotic features: Secondary | ICD-10-CM | POA: Diagnosis not present

## 2013-10-19 NOTE — Progress Notes (Signed)
Patient ID: Allison Cruz, female   DOB: [birth year], 58 y.o.   MRN: [id number] Pt reported to [facility] for Repetitve Transcranial Magnetic Stimulation treatment for Major Depressive Disorder. Pt stated that she did not sleep well Saturday night (10/17/13). This caused her to feel lethargic and affectively "dull" on Sunday 10/18/13. Pt stated that she remembers having intermittent difficulty sleeping during her previous course of TMS tx. Pt tolerated tx well. %MT remained at 120% for the duration of tx.

## 2013-10-20 ENCOUNTER — Other Ambulatory Visit (HOSPITAL_COMMUNITY): Payer: Medicare Other | Attending: Psychiatry | Admitting: *Deleted

## 2013-10-20 VITALS — BP 106/63 | HR 72

## 2013-10-20 DIAGNOSIS — F332 Major depressive disorder, recurrent severe without psychotic features: Secondary | ICD-10-CM

## 2013-10-20 NOTE — Progress Notes (Signed)
Patient ID: Allison Cruz, female   DOB: Mar 22, 1956, 58 y.o.   MRN: [id number] Pt reported to [facility] for Repetitve Transcranial Magnetic Stimulation treatment for Major Depressive Disorder. Pt reported that she slept well last night. Pt reported that she unintentionally drove past the clinic this morning on her way here for [place] tx and drove to her church. Pt then realized what she had done and drove back to the clinic. Pt was conversational today, conversing with this Probation officer for the duration of her tx session. Pt tolerated tx well. %MT remained at 120% for the duration of tx.

## 2013-10-21 ENCOUNTER — Other Ambulatory Visit (HOSPITAL_COMMUNITY): Payer: Medicare Other | Attending: Psychiatry | Admitting: *Deleted

## 2013-10-21 VITALS — BP 111/79 | HR 68 | Ht 65.0 in | Wt 189.0 lb

## 2013-10-21 DIAGNOSIS — F332 Major depressive disorder, recurrent severe without psychotic features: Secondary | ICD-10-CM

## 2013-10-21 NOTE — Progress Notes (Signed)
Patient ID: Allison Cruz, female   DOB: [birth year], 58 y.o.   MRN: [id number] Pt reported to [facility] for Repetitve Transcranial Magnetic Stimulation treatment for Major Depressive Disorder.  Pt completed a PHQ-9 with a score of 23 (severe derpression). This is only the pt's 10th [place] session, so further tx is warranted to decrease the pt's depressive symptoms. Pt stated that Allison Cruz she recalls benefiting from [place] in approximately the 4th week of tx during Allison Cruz previous course of [place]. Pt tolerated tx well. %MT remained at 120% for the duration of tx.

## 2013-10-22 ENCOUNTER — Other Ambulatory Visit (HOSPITAL_COMMUNITY): Payer: Medicare Other | Attending: Psychiatry | Admitting: *Deleted

## 2013-10-22 VITALS — BP 110/66 | HR 78

## 2013-10-22 DIAGNOSIS — F332 Major depressive disorder, recurrent severe without psychotic features: Secondary | ICD-10-CM

## 2013-10-22 NOTE — Progress Notes (Signed)
Patient ID: Allison Cruz, female   DOB: 07-07-1955, 58 y.o.   MRN: [id number] Pt reported to [facility] for Repetitve Transcranial Magnetic Stimulation treatment for Major Depressive Disorder. Pt reported that she missed the clinic again this morning on her drive here, "It slipped my mind and I thought I was driving to church." Pt presented with flat affect. Pt stated that, during her previous course of TMS tx, the first sign that she was responding to tx was that she would feel less depressed for several hours after each tx session. Pt stated that she has not experienced this yet with this course of tx. Pt stated that she wasn't bothered by this because it is still early in her course of tx. Pt tolerated tx well. %MT remained at 120% for the duration of tx.

## 2013-10-23 ENCOUNTER — Other Ambulatory Visit (HOSPITAL_COMMUNITY): Payer: Medicare Other | Attending: Psychiatry | Admitting: *Deleted

## 2013-10-23 VITALS — BP 103/68 | HR 68

## 2013-10-23 DIAGNOSIS — F332 Major depressive disorder, recurrent severe without psychotic features: Secondary | ICD-10-CM

## 2013-10-23 NOTE — Progress Notes (Signed)
Patient ID: Allison Cruz, female   DOB: 02/13/1956, 58 y.o.   MRN: [id number] Pt reported to [facility] for Repetitve Transcranial Magnetic Stimulation treatment for Major Depressive Disorder.  Pt presented with flat affect. Pt reported that her psychiatrist increased her Wellbutrin from 1/4 of a 75mg  tablet to 1/2 of a 75 mg tablet effective 10/22/13. Pt stated that she had not yet begun to take her Wellbutrin this way. Will notify MD. Pt tolerated tx well. %MT remained at 120% for the duration of tx.

## 2013-10-26 ENCOUNTER — Ambulatory Visit (INDEPENDENT_AMBULATORY_CARE_PROVIDER_SITE_OTHER): Payer: Medicare Other | Admitting: Licensed Clinical Social Worker

## 2013-10-26 ENCOUNTER — Other Ambulatory Visit (HOSPITAL_COMMUNITY): Payer: Medicare Other | Attending: Psychiatry | Admitting: *Deleted

## 2013-10-26 VITALS — BP 102/74 | HR 82

## 2013-10-26 DIAGNOSIS — F332 Major depressive disorder, recurrent severe without psychotic features: Secondary | ICD-10-CM | POA: Diagnosis not present

## 2013-10-26 DIAGNOSIS — F331 Major depressive disorder, recurrent, moderate: Secondary | ICD-10-CM

## 2013-10-26 NOTE — Progress Notes (Signed)
Patient ID: Allison Cruz, female   DOB: 1955/04/26, 58 y.o.   MRN: [id number] Pt reported to [facility] for Repetitve Transcranial Magnetic Stimulation treatment for Major Depressive Disorder. Tx session attended by [facility] orientee and PA student. After start of tx, pt stated the coil and pulses felt farther forward that they usually do. Writer checked coil positioning parameters for proper coil placement. All parameters were correct per pt's calculated tx location. Coil removed from pt's head and pt was instructed to sit up in the chair. Pt then positioned her head back on the head support system, her head was realigned, and coil replaced. Pt stated that the coil and pulses felt more normal after this intervention. Pt tolerated tx well. %MT remained at 120% for the duration of tx.

## 2013-10-27 ENCOUNTER — Other Ambulatory Visit (HOSPITAL_COMMUNITY): Payer: Medicare Other | Attending: Psychiatry | Admitting: *Deleted

## 2013-10-27 VITALS — BP 109/76 | HR 76

## 2013-10-27 DIAGNOSIS — F332 Major depressive disorder, recurrent severe without psychotic features: Secondary | ICD-10-CM

## 2013-10-27 NOTE — Progress Notes (Signed)
Patient ID: Allison Cruz, female   DOB: Feb 11, 1956, 58 y.o.   MRN: [id number] Pt reported to [facility] for Repetitve Transcranial Magnetic Stimulation treatment for Major Depressive Disorder. Pt reported that she is feeling more acutely depressed, with her symptoms worsening over the weekend. Pt stated that it is very hard for her to get off the couch. Pt shared that she would like to stop isolating so much, but feels that her depression must decrease before she can invest in relationships. Pt tolerated tx well. %MT remained at 120% for the duration of tx. Tx administered per parameters calculated at initial [place] appointment. Pt had no complaints post-tx.

## 2013-10-28 ENCOUNTER — Other Ambulatory Visit (HOSPITAL_COMMUNITY): Payer: Medicare Other | Attending: Psychiatry | Admitting: *Deleted

## 2013-10-28 VITALS — BP 112/71 | HR 75 | Ht 65.0 in | Wt 187.4 lb

## 2013-10-28 DIAGNOSIS — F332 Major depressive disorder, recurrent severe without psychotic features: Secondary | ICD-10-CM | POA: Diagnosis not present

## 2013-10-28 NOTE — Progress Notes (Signed)
Patient ID: Allison Cruz, female   DOB: 09-17-1955, 58 y.o.   MRN: [id number] Pt reported to [facility] for Repetitve Transcranial Magnetic Stimulation treatment for Major Depressive Disorder. Pt reported that she was feeling somewhat better today re: her depression than she has bene feeling the past several days. Pt still unable to identify a trigger for the increase in her depressive symptoms that she experienced last weekend. Pt completed a PHQ-9 with a score of 21 (severe). This is an improvement from her previous score of 23 on 10/21/13. Pt tolerated tx well. %MT remained at 120% for the duration of tx. Pt with no complaints post-tx.

## 2013-10-29 ENCOUNTER — Other Ambulatory Visit (HOSPITAL_COMMUNITY): Payer: Medicare Other | Attending: Psychiatry | Admitting: *Deleted

## 2013-10-29 VITALS — BP 119/67 | HR 68

## 2013-10-29 DIAGNOSIS — F332 Major depressive disorder, recurrent severe without psychotic features: Secondary | ICD-10-CM

## 2013-10-29 NOTE — Progress Notes (Signed)
Patient ID: Allison Cruz, female   DOB: Oct 28, 1955, 58 y.o.   MRN: [id number] Pt reported to [facility] for Repetitve Transcranial Magnetic Stimulation treatment for Major Depressive Disorder. Pt reported that there were no changes in her medications, appetite, food/caffeine/alcohol consumption, metal implants, etc since her previous session. Pt reported that she slept better the previous night than she has been sleeping. Pt tolerated tx well. %MT remained at 120% for the duration of tx. Pt with no complaints post-tx.

## 2013-10-30 ENCOUNTER — Other Ambulatory Visit (HOSPITAL_COMMUNITY): Payer: Medicare Other | Attending: Psychiatry | Admitting: *Deleted

## 2013-10-30 VITALS — BP 105/76 | HR 88

## 2013-10-30 DIAGNOSIS — F332 Major depressive disorder, recurrent severe without psychotic features: Secondary | ICD-10-CM

## 2013-10-30 NOTE — Progress Notes (Signed)
Patient ID: Allison Cruz, female   DOB: 04/06/1955, 58 y.o.   MRN: [id number] Pt reported to [facility] for Repetitve Transcranial Magnetic Stimulation treatment for Major Depressive Disorder. Pt reported that she visited with her pastor on 10/28/2013. Pt reported that she also went out to dinner with her best friend whom she had not seen in a long time on the evening of 10/29/13. Pt reported that she experienced anxiety during the drive to the restaurant with her best friend, but she enjoyed their visit. This is a sign of the pt's progress, as she has isolated herself from friends and other relationships for months. When asked if the pt sees these actions as progress, pt responded "I sure hope so." Pt tolerated tx well. %MT remained at 120% for the duration of tx. Pt with no complaints post-tx. Pt stated that she plans to challenge herself to get out of her condo at least 1X over the weekend.

## 2013-11-02 ENCOUNTER — Other Ambulatory Visit (HOSPITAL_COMMUNITY): Payer: Medicare Other | Attending: Psychiatry | Admitting: *Deleted

## 2013-11-02 VITALS — BP 117/73 | HR 71

## 2013-11-02 DIAGNOSIS — F332 Major depressive disorder, recurrent severe without psychotic features: Secondary | ICD-10-CM | POA: Diagnosis not present

## 2013-11-02 NOTE — Progress Notes (Signed)
Patient ID: Allison Cruz, female   DOB: 1955-10-24, 58 y.o.   MRN: [id number] Pt reported to [facility] for Repetitve Transcranial Magnetic Stimulation treatment for Major Depressive Disorder. Pt presented with flat affect. Pt reported that she experienced decreased depressive symptoms on Saturday 10/31/13. Pt got out of the house, taking a driving and getting food on her own. Pt reported that she then experienced a drastic increase in depressive symptoms on Sunday 11/01/13. Pt stated "I just woke up feeling really depressed and anxious, and the only thing I knew to do was hunker down on the couch and pray that it would pass." Pt reported no changes over the weekend in her medication regimen, her caffeine intake, her appetite/intake, etc. Pt tolerated tx well. %MT remained at 120% for the duration of tx. Pt with no complaints post-tx. Pt departed from clinic alone without issue.

## 2013-11-03 ENCOUNTER — Other Ambulatory Visit (HOSPITAL_COMMUNITY): Payer: Medicare Other | Attending: Psychiatry | Admitting: *Deleted

## 2013-11-03 VITALS — BP 107/70 | HR 76

## 2013-11-03 DIAGNOSIS — F332 Major depressive disorder, recurrent severe without psychotic features: Secondary | ICD-10-CM | POA: Diagnosis not present

## 2013-11-03 NOTE — Progress Notes (Signed)
Patient ID: Allison Cruz, female   DOB: 1955/06/16, 58 y.o.   MRN: [id number] Pt reported to [facility] for Repetitve Transcranial Magnetic Stimulation treatment for Major Depressive Disorder. Pt reported there were no changes in her medication regimen, sleep pattern, appetite/intake, alcohol/substance use, metal implant status, or caffeine intake since her previous treatment. Pt tolerated tx well. %MT remained at 120% for the duration of tx. Pt provided with resources for outpatient EMDR, per her request. Pt with no complaints post-tx, and she departed from the clinic without issue.

## 2013-11-04 ENCOUNTER — Other Ambulatory Visit (HOSPITAL_COMMUNITY): Payer: Medicare Other | Attending: Psychiatry | Admitting: *Deleted

## 2013-11-04 VITALS — BP 97/69 | HR 75 | Ht 65.0 in | Wt 188.4 lb

## 2013-11-04 DIAGNOSIS — F332 Major depressive disorder, recurrent severe without psychotic features: Secondary | ICD-10-CM

## 2013-11-04 NOTE — Progress Notes (Signed)
Patient ID: Allison Cruz, female   DOB: 1955/12/31, 58 y.o.   MRN: [id number] Pt reported to [facility] for Repetitve Transcranial Magnetic Stimulation treatment for Major Depressive Disorder. Pt completed a PHQ-9 with a score of 21 (severe). Pt initially stated that she was unaware of obstacles that could be impeding her progress. She later stated that this time of year is very hard for her, as her father passed away on 12-07-2011, and he was very sick for several weeks preceding his death. Pt reported this still takes a toll on her. Pt also stated that it is difficult for her to remain optimistic about this course of treatment, as her response has not been exactly the same as a previous course of [place] tx. Pt stated that there were no changes in her appetite/intake, alcohol/substance use, sleep pattern, medication regimen, or metal implant status since the previous tx session. Pt tolerated tx well. At approximately 5 minutes into tx, writer noticed that the pt's %MT was set to 115%. Writer corrected this, setting %MT to 120%, where it remained for the rest of tx. Pt with no complaints post-tx. Pt departed from clinic without issue.

## 2013-11-05 ENCOUNTER — Other Ambulatory Visit (HOSPITAL_COMMUNITY): Payer: Medicare Other | Attending: Psychiatry | Admitting: *Deleted

## 2013-11-05 VITALS — BP 106/60 | HR 68

## 2013-11-05 DIAGNOSIS — F332 Major depressive disorder, recurrent severe without psychotic features: Secondary | ICD-10-CM | POA: Diagnosis not present

## 2013-11-05 NOTE — Progress Notes (Signed)
Patient ID: Allison Cruz, female   DOB: [birth year], 58 y.o.   MRN: [id number] Pt reported to [facility] for Repetitve Transcranial Magnetic Stimulation treatment for Major Depressive Disorder. Pt presented with flat affect. Pt reported that there were no changes in her medication regimen, caffeine consumption, alcohol/substance use, sleep pattern, metal implant status, or appetite/intake since her previous treatment session. Pt tolerated tx well. %MT remained at 120% for the duration of tx. Pt with no complaints post-tx. Pt departed from clinic without issue.

## 2013-11-06 ENCOUNTER — Other Ambulatory Visit (HOSPITAL_COMMUNITY): Payer: Medicare Other | Attending: Psychiatry | Admitting: *Deleted

## 2013-11-06 VITALS — BP 114/72 | HR 68

## 2013-11-06 DIAGNOSIS — F332 Major depressive disorder, recurrent severe without psychotic features: Secondary | ICD-10-CM

## 2013-11-06 NOTE — Progress Notes (Signed)
Patient ID: Allison Cruz, female   DOB: 1955/11/08, 58 y.o.   MRN: [id number] Pt reported to [facility] for Repetitve Transcranial Magnetic Stimulation treatment for Major Depressive Disorder. Pt reported that she forgot to take her Brintellix and Wellbutrin this morning prior to tx. Pt stated she would take them when she got home. Pt also reported she did not sleep well last night. Pt reported there were no changes in her alcohol/substance use, appetite/intake, caffeine consumption, or metal implant status since the previous tx. Pt tolerated tx well. %MT remained at 120% for the duration of tx. Pt with no complaints post-tx. Pt departed from clinic without issue.

## 2013-11-09 ENCOUNTER — Other Ambulatory Visit (HOSPITAL_COMMUNITY): Payer: Medicare Other | Attending: Psychiatry | Admitting: *Deleted

## 2013-11-09 ENCOUNTER — Ambulatory Visit (INDEPENDENT_AMBULATORY_CARE_PROVIDER_SITE_OTHER): Payer: Medicare Other | Admitting: Licensed Clinical Social Worker

## 2013-11-09 VITALS — BP 116/74 | HR 71

## 2013-11-09 DIAGNOSIS — F332 Major depressive disorder, recurrent severe without psychotic features: Secondary | ICD-10-CM | POA: Diagnosis not present

## 2013-11-09 DIAGNOSIS — F331 Major depressive disorder, recurrent, moderate: Secondary | ICD-10-CM

## 2013-11-09 NOTE — Progress Notes (Signed)
Patient ID: Allison Cruz, female   DOB: 1956-01-13, 58 y.o.   MRN: [id number] Pt reported to [facility] for Repetitve Transcranial Magnetic Stimulation treatment for Major Depressive Disorder. Pt reported that she had a "flat" weekend. Pt stated she went out to eat with friends 1X, but she didn't enjoy it. Pt reported no changes in her medication regimen, sleep pattern, alcohol/substance use, appetite/intake, or metal implant status since her previous tx. Pt stated she has been feeling very anxious re: her psychiatrist retiring soon, and the fact that it is late in her course of [place] tx and she does not feel significantly better. Pt tolerated tx well. %MT remained at 120% for the duration of tx. Pt with no complaints post-tx. Pt departed from clinic without issue.

## 2013-11-10 ENCOUNTER — Other Ambulatory Visit (HOSPITAL_COMMUNITY): Payer: Medicare Other | Attending: Psychiatry | Admitting: *Deleted

## 2013-11-10 VITALS — BP 109/67 | HR 74

## 2013-11-10 DIAGNOSIS — F332 Major depressive disorder, recurrent severe without psychotic features: Secondary | ICD-10-CM | POA: Diagnosis not present

## 2013-11-10 NOTE — Progress Notes (Signed)
Patient ID: Allison Cruz, female   DOB: 08/24/1955, 58 y.o.   MRN: [id number] Pt reported to [facility] for Repetitve Transcranial Magnetic Stimulation treatment for Major Depressive Disorder.  Pt reported that she began taking Phentermine 15 mg qd yesterday 11/09/13. Pt took 1/2 capsule yesterday, as prescribed by her psychiatrist, and she will begin taking 1 capsule today. This medication is prescribed as an augmenting agent for her antidepressant regimen. Pt reported that she felt somewhat better re: her depression toward the end of tx yesterday and after tx throughout the day. Pt stated "yesterday was a good day for me." Pt tolerated tx well. %MT remained at 120% for the duration of tx. Pt with no complaints post-tx. Pt depart from clinic without issue.

## 2013-11-11 ENCOUNTER — Other Ambulatory Visit (HOSPITAL_COMMUNITY): Payer: Medicare Other | Attending: Psychiatry | Admitting: *Deleted

## 2013-11-11 VITALS — BP 103/71 | HR 78 | Ht 65.0 in | Wt 187.2 lb

## 2013-11-11 DIAGNOSIS — F332 Major depressive disorder, recurrent severe without psychotic features: Secondary | ICD-10-CM | POA: Diagnosis not present

## 2013-11-11 NOTE — Progress Notes (Signed)
Patient ID: Allison Cruz, female   DOB: 24-Nov-1955, 58 y.o.   MRN: [id number] Pt reported to [facility] for Repetitve Transcranial Magnetic Stimulation treatment for Major Depressive Disorder. Pt presented with a broader range of affect today, as compared with her usual flat affect. Pt reported that there were no changes in her sleep, appetite/intake, alcohol/substance use, caffeine consumption, or metal implant status since her previous tx. Pt began taking a full capsule of Phentermine 15 mg yesterday. Pt completed a PHQ-9 with a score of 18 (moderately severe). This is a reduction from her previous week's score of 21 and her baseline score of 23. Pt states that she has felt better this week than she did previously. Pt appears to be making progress re: her depression. Pt tolerated tx well. %MT remained at 120% for the duration of tx. Pt with no complaints post-tx. Pt departed from clinic without issue.

## 2013-11-12 ENCOUNTER — Other Ambulatory Visit (HOSPITAL_COMMUNITY): Payer: Medicare Other | Attending: Psychiatry | Admitting: *Deleted

## 2013-11-12 VITALS — BP 101/63 | HR 74

## 2013-11-12 DIAGNOSIS — F332 Major depressive disorder, recurrent severe without psychotic features: Secondary | ICD-10-CM

## 2013-11-12 NOTE — Progress Notes (Signed)
Patient ID: Allison Cruz, female   DOB: 07/04/1955, 58 y.o.   MRN: [id number] Pt reported to [facility] for Repetitve Transcranial Magnetic Stimulation treatment for Major Depressive Disorder. Pt reported no change in medication regimen, alcohol/substance use, caffeine consumption, sleep pattern, or metal implant status since previous tx. Pt completed a Beck's Depression Inventory with a score of 41 (extreme depression). This is decreased from her baseline score of 47 on 10/08/13. Pt's depressive symptoms appear to be decreasing, so further tx is warranted. Pt tolerated tx well. %MT remained at 120% for the duration of tx. Pt with no complaints post-tx. Pt departed from clinic without issue.

## 2013-11-13 ENCOUNTER — Other Ambulatory Visit (HOSPITAL_COMMUNITY): Payer: Medicare Other | Attending: Psychiatry | Admitting: *Deleted

## 2013-11-13 VITALS — BP 111/72 | HR 72

## 2013-11-13 DIAGNOSIS — F332 Major depressive disorder, recurrent severe without psychotic features: Secondary | ICD-10-CM

## 2013-11-13 NOTE — Progress Notes (Signed)
Patient ID: Allison Cruz, female   DOB: Sep 23, 1955, 58 y.o.   MRN: [id number] Pt reported to [facility] for Repetitve Transcranial Magnetic Stimulation treatment for Major Depressive Disorder. Pt continues to present with flat affect. Pt reported no change in medication regimen, alcohol/substance use, caffeine consumption, sleep pattern, or metal implant status since previous tx. Pt tolerated tx well. %MT remained at 120% for the duration of tx. Pt with no complaints post-tx. Pt departed from clinic without issue.

## 2013-11-16 ENCOUNTER — Other Ambulatory Visit (HOSPITAL_COMMUNITY): Payer: Medicare Other | Attending: Psychiatry | Admitting: *Deleted

## 2013-11-16 VITALS — BP 109/70 | HR 80

## 2013-11-16 DIAGNOSIS — F332 Major depressive disorder, recurrent severe without psychotic features: Secondary | ICD-10-CM | POA: Diagnosis not present

## 2013-11-16 NOTE — Progress Notes (Signed)
Patient ID: Allison Cruz, female   DOB: 12/26/1955, 58 y.o.   MRN: [id number] Pt reported to [facility] for Repetitve Transcranial Magnetic Stimulation treatment for Major Depressive Disorder. Pt reported that she felt "really weird" beginning mid afternoon on Friday 11/13/13 through the following day. Pt stated she contacted her psychiatrist, who told her she likely had a build up of Brintellix in her system due to the half-life of that medication. Psychiatrist directed pt to take only half of her 20 mg dose of Brintellix today and tomorrow in order to counteract this effect. Pt is complying. Pt reported that these symptoms abated yesterday. Pt reported no change in medication regimen, alcohol/substance use, caffeine consumption, sleep pattern, or metal implant status since previous tx. Pt tolerated tx well. %MT remained at 120% for the duration of tx. Pt with no complaints post-tx. Pt departed from clinic without issue.

## 2013-11-17 ENCOUNTER — Other Ambulatory Visit (HOSPITAL_COMMUNITY): Payer: Medicare Other | Attending: Psychiatry | Admitting: *Deleted

## 2013-11-17 VITALS — BP 103/72 | HR 69

## 2013-11-17 DIAGNOSIS — F332 Major depressive disorder, recurrent severe without psychotic features: Secondary | ICD-10-CM

## 2013-11-17 NOTE — Progress Notes (Signed)
Patient ID: MARLISE FAHR, female   DOB: 06/30/1955, 58 y.o.   MRN: [id number] Pt reported to [facility] for Repetitve Transcranial Magnetic Stimulation treatment for Major Depressive Disorder. Pt reported no change in medication regimen, alcohol/substance use, caffeine consumption, sleep pattern, or metal implant status since previous tx. Pt presented with flat affect for tx, appearing more depressed than normal. Writer inquired about pt's mood. Pt stated she feels anxious re: approaching the end of her course of tx and not having seen a substantial benefit yet. Writer encouraged pt to remain optimistic, as there have been a number of previous pts who have not seen a dramatic benefit from [place] tx until after their course of tx is complete. Pt tolerated tx well. %MT remained at 120% for the duration of tx. Pt with no complaints post-tx. Pt departed from clinic without issue.

## 2013-11-18 ENCOUNTER — Other Ambulatory Visit (HOSPITAL_COMMUNITY): Payer: Medicare Other | Attending: Psychiatry | Admitting: *Deleted

## 2013-11-18 VITALS — BP 99/73 | HR 72

## 2013-11-18 DIAGNOSIS — F332 Major depressive disorder, recurrent severe without psychotic features: Secondary | ICD-10-CM

## 2013-11-18 NOTE — Progress Notes (Signed)
Patient ID: Allison Cruz, female   DOB: May 08, 1955, 58 y.o.   MRN: [id number] Pt reported to [facility] for Repetitve Transcranial Magnetic Stimulation treatment for Major Depressive Disorder. Pt reported no change in medication regimen, alcohol/substance use, caffeine consumption, sleep pattern, or metal implant status since previous tx. Pt expressed that she continues to feel highly anxious re: the end of her course of [place] approaching. Pt completed a PHQ-9 with a score of 17 (Moderately severe depression). This is decreased from the previous week's score of 18. Pt continues to show progress toward remission as evidenced by falling PHQ-9 scores, so continued tx is warranted. Pt tolerated tx well. %MT remained at 120% for the duration of tx. Pt with no complaints post-tx. Pt departed from clinic without issue.

## 2013-11-20 ENCOUNTER — Other Ambulatory Visit (HOSPITAL_COMMUNITY): Payer: Medicare Other | Attending: Psychiatry | Admitting: *Deleted

## 2013-11-20 VITALS — BP 99/65 | HR 83

## 2013-11-20 DIAGNOSIS — F332 Major depressive disorder, recurrent severe without psychotic features: Secondary | ICD-10-CM

## 2013-11-20 NOTE — Progress Notes (Signed)
Patient ID: Allison Cruz, female   DOB: [birth year], 58 y.o.   MRN: [id number] Pt reported to [facility] for Repetitve Transcranial Magnetic Stimulation treatment for Major Depressive Disorder. Pt reported no change in alcohol/substance use, caffeine consumption, sleep pattern, or metal implant status since previous tx. Pt reported that on 11/19/13 she began taking 30 mg of Phentermine as opposed to the 15 mg she had been taking, per the direction of her psychiatrist. Pt reported that she experienced increased anxiety yesterday, and she attributes this to the increase in Phentermine. Pt shared that she is worried about her course of [place] tx coming to an end soon, which will leave her with nothing to do but sit at home and rhuminate. Pt tolerated tx well. %MT remained at 120% for the duration of tx. Pt with no complaints post-tx. Pt departed from clinic without issue.

## 2013-11-23 ENCOUNTER — Other Ambulatory Visit (HOSPITAL_COMMUNITY): Payer: Medicare Other | Attending: Psychiatry | Admitting: *Deleted

## 2013-11-23 ENCOUNTER — Ambulatory Visit: Payer: Medicare Other | Admitting: Licensed Clinical Social Worker

## 2013-11-23 VITALS — BP 103/65 | HR 74

## 2013-11-23 DIAGNOSIS — F332 Major depressive disorder, recurrent severe without psychotic features: Secondary | ICD-10-CM

## 2013-11-23 NOTE — Progress Notes (Signed)
Patient ID: Allison Cruz, female   DOB: 06-May-1955, 58 y.o.   MRN: [id number] Pt reported to [facility] for Repetitve Transcranial Magnetic Stimulation treatment for Major Depressive Disorder. Pt reported that she had a bad reaction to her increased dose of Phentermine on Saturday 11/21/13. The pt's dose had been increased from 15 mg to 30 mg last week. Pt stated she became very nauseous and anxious. Pt called her prescribing psychiatrist, who discontinued the medication. Pt stated she did not experience any improvement in her mood over the weekend. Pt reported no change in alcohol/substance use, caffeine consumption, sleep pattern, or metal implant status since previous tx. Pt tolerated tx well. %MT remained at 120% for the duration of tx. Pt with no complaints post-tx. Pt departed from clinic without issue.

## 2013-11-24 ENCOUNTER — Other Ambulatory Visit (HOSPITAL_COMMUNITY): Payer: Medicare Other | Attending: Psychiatry | Admitting: *Deleted

## 2013-11-24 VITALS — BP 98/64 | HR 82

## 2013-11-24 DIAGNOSIS — F332 Major depressive disorder, recurrent severe without psychotic features: Secondary | ICD-10-CM

## 2013-11-24 NOTE — Progress Notes (Signed)
Patient ID: JAE BRUCK, female   DOB: 1955-12-26, 58 y.o.   MRN: [id number] Pt reported to [facility] for Repetitve Transcranial Magnetic Stimulation treatment for Major Depressive Disorder. Pt reported that she woke up with a headache this morning, for which she took one regular strength Tylenol and one regular strength Advil. Pt stated she found it somewhat easier to get out of bed this morning, and she presented to clinic for treatment with brighter, less constricted affect than normal. Pt reported no change in medication regimen, alcohol/substance use, caffeine consumption, sleep pattern, or metal implant status since previous tx. Pt tolerated tx well. %MT remained at 120% for the duration of tx. Pt with no complaints post-tx. Pt departed from clinic without issue.

## 2013-11-27 ENCOUNTER — Other Ambulatory Visit (INDEPENDENT_AMBULATORY_CARE_PROVIDER_SITE_OTHER): Payer: Medicare Other | Admitting: *Deleted

## 2013-11-27 VITALS — BP 103/70 | HR 75 | Ht 65.0 in | Wt 191.4 lb

## 2013-11-27 DIAGNOSIS — F332 Major depressive disorder, recurrent severe without psychotic features: Secondary | ICD-10-CM

## 2013-11-27 NOTE — Progress Notes (Signed)
Patient ID: Allison Cruz, female   DOB: 1955-07-22, 58 y.o.   MRN: [id number] Pt reported to [facility] for Repetitve Transcranial Magnetic Stimulation treatment for Major Depressive Disorder. Pt reported no change in medication regimen, alcohol/substance use, caffeine consumption, sleep pattern, or metal implant status since previous tx. Pt completed a PHQ-9 with a score of 17 (moderately severe depression). This is no change from the pt' s score the previous week. Pt tolerated tx well. %MT remained at 120% for the duration of tx. Pt with no complaints post-tx. Pt departed from clinic without issue.

## 2013-11-30 ENCOUNTER — Other Ambulatory Visit (INDEPENDENT_AMBULATORY_CARE_PROVIDER_SITE_OTHER): Payer: Medicare Other | Admitting: *Deleted

## 2013-11-30 VITALS — BP 102/62 | HR 68

## 2013-11-30 DIAGNOSIS — F332 Major depressive disorder, recurrent severe without psychotic features: Secondary | ICD-10-CM

## 2013-11-30 NOTE — Progress Notes (Signed)
Patient ID: Allison Cruz, female   DOB: 1955/07/14, 58 y.o.   MRN: [id number] Pt reported to [facility] for Repetitve Transcranial Magnetic Stimulation treatment for Major Depressive Disorder. Pt reported that she felt very isolative over the weekend, and would not have gotten out of the house had it not been for her cousin visiting from out of town. Pt reported no change in medication regimen, alcohol/substance use, caffeine consumption, sleep pattern, or metal implant status since previous tx. Pt tolerated tx well. %MT remained at 120% for the duration of tx. Pt with no complaints post-tx. Pt departed from clinic without issue.

## 2013-12-03 ENCOUNTER — Ambulatory Visit: Payer: Medicare Other | Admitting: Obstetrics & Gynecology

## 2013-12-04 ENCOUNTER — Other Ambulatory Visit (HOSPITAL_COMMUNITY): Payer: Medicare Other | Attending: Psychiatry | Admitting: *Deleted

## 2013-12-04 DIAGNOSIS — F329 Major depressive disorder, single episode, unspecified: Secondary | ICD-10-CM | POA: Insufficient documentation

## 2013-12-04 DIAGNOSIS — F332 Major depressive disorder, recurrent severe without psychotic features: Secondary | ICD-10-CM

## 2013-12-09 ENCOUNTER — Ambulatory Visit: Payer: Medicare Other | Admitting: Licensed Clinical Social Worker

## 2013-12-15 ENCOUNTER — Telehealth (HOSPITAL_COMMUNITY): Payer: Self-pay | Admitting: *Deleted

## 2013-12-15 NOTE — Telephone Encounter (Signed)
Called pt to f/u re: progress from [place] tx for MDD. Administered PHQ-9 over the phone. Pt scored a 7 (mild depression). Pt stated that she feels a little more depressed than she did on the date of her final tx session, although she scored slightly higher on the PHQ-9 she took that day (scored a 10 on 12/04/13). Pt reported that her medications had been adjusted by her psychiatrist since her last [place] session, as "I felt like I was slipping." Pt currently taking Wellbutrin XL 150 mg qd, Ritalin 10 mg qd, and Brintellix 20 mg qd. "My night meds are all the same." Informed pt that this writer is currently seeking approval for further session from her insurance. Scheduled follow up visit with [place] NP for 12/23/13 at 1:30pm.

## 2013-12-15 NOTE — Progress Notes (Signed)
Patient ID: Allison Cruz, female   DOB: November 26, 1955, 58 y.o.   MRN: [id number] Pt reported to [facility] for Repetitve Transcranial Magnetic Stimulation treatment for Major Depressive Disorder. Pt completed a PHQ-9 with a score of 10 (moderate depression). Pt completed a Beck's Depression Inventory with a score of 28 (moderate depression). Pt tolerated tx well. %MT remained at 120% for the duration of tx. Pt with no complaints post-tx. Pt departed from clinic without issue. Treatment provided by Debarah Crape, RN.

## 2013-12-17 ENCOUNTER — Other Ambulatory Visit (HOSPITAL_COMMUNITY): Payer: Self-pay | Admitting: *Deleted

## 2013-12-21 ENCOUNTER — Ambulatory Visit (HOSPITAL_COMMUNITY): Payer: Self-pay | Admitting: Psychiatry

## 2013-12-23 ENCOUNTER — Ambulatory Visit (INDEPENDENT_AMBULATORY_CARE_PROVIDER_SITE_OTHER): Payer: Medicare Other | Admitting: Psychiatry

## 2013-12-23 ENCOUNTER — Encounter (HOSPITAL_COMMUNITY): Payer: Self-pay | Admitting: Psychiatry

## 2013-12-23 VITALS — BP 101/74 | HR 85 | Ht 65.0 in | Wt 192.6 lb

## 2013-12-23 DIAGNOSIS — F331 Major depressive disorder, recurrent, moderate: Secondary | ICD-10-CM

## 2013-12-23 NOTE — Progress Notes (Signed)
   [facility] Follow-up Outpatient Visit  LAKETIA VICKNAIR 1955-09-23  Date:  11/22/13 Subjective: Pt is here for follow up [place] Pt is doing better, with [place]. Initial PHQ9 23, Beck 47, and latest PHQ 9 is 7, and Becks 28. Pt's psychiatrist is retiring, and she feels down from that. Pt wants to do maintenance [place]. Insurance is not paying for maintenance. Pt will consider paying out of pocket. She denies SI/HI/AVH. Medications were changed: Ritalin 5mg , 2 times daily for depression, brintellix 10 mg for depression, and bupropion SR 100 mg for depression. Pt wants to do maintenance [place], given her history of severe depression. Pt feels she is going back into severe depression. Will get approval from Dr. Dwyane Dee. Pt wants to start next week.   There were no vitals filed for this visit.  Mental Status Examination  Appearance: casual  Alert: Yes Attention: fair  Cooperative: Yes Eye Contact: Fair Speech: slow Psychomotor Activity: Psychomotor Retardation Memory/Concentration: fair   Oriented: time/date and situation Mood: Anxious and Depressed Affect: Constricted and Depressed Thought Processes and Associations: Coherent Fund of Knowledge: Fair Thought Content: preoccupations Insight: Fair Judgement: Fair  Diagnosis:  MDD, recurrent, moderate   Treatment Plan:  Brintellix 10 mg daily-for depression Bupropion SR 100 mg mg for depression  Ritalin 5 mg, 2 times, for energy   Madison Hickman, NP

## 2014-01-04 ENCOUNTER — Other Ambulatory Visit (HOSPITAL_COMMUNITY): Payer: Medicare Other | Attending: Psychiatry | Admitting: *Deleted

## 2014-01-04 VITALS — BP 98/59 | HR 73

## 2014-01-04 DIAGNOSIS — F332 Major depressive disorder, recurrent severe without psychotic features: Secondary | ICD-10-CM

## 2014-01-04 DIAGNOSIS — F331 Major depressive disorder, recurrent, moderate: Secondary | ICD-10-CM | POA: Insufficient documentation

## 2014-01-04 NOTE — Progress Notes (Signed)
Patient ID: Allison Cruz, female   DOB: 03-20-1956, 58 y.o.   MRN: [id number] Pt reported to [facility] for Repetitve Transcranial Magnetic Stimulation treatment for Major Depressive Disorder. This is the first session in the pt's maintenance phase of tx. Pt reported no change in alcohol/substance use, caffeine consumption, sleep pattern, or metal implant status since previous tx. Pt has had some changes in her medication regimen. These changes are noted in the medication list associated with this encounter. Number of pulses per session was increased from 3000 to 5000, as the pt is only receiving 1 tx session per week for maintenance. Pt tolerated tx well. %MT remained at 120% for the duration of tx. Pt with no complaints post-tx. Pt departed from clinic without issue.

## 2014-01-11 ENCOUNTER — Other Ambulatory Visit (HOSPITAL_COMMUNITY): Payer: Medicare Other

## 2014-01-12 ENCOUNTER — Other Ambulatory Visit (INDEPENDENT_AMBULATORY_CARE_PROVIDER_SITE_OTHER): Payer: Medicare Other | Admitting: *Deleted

## 2014-01-12 VITALS — BP 107/71 | HR 68

## 2014-01-12 DIAGNOSIS — F332 Major depressive disorder, recurrent severe without psychotic features: Secondary | ICD-10-CM

## 2014-01-12 DIAGNOSIS — F331 Major depressive disorder, recurrent, moderate: Secondary | ICD-10-CM | POA: Diagnosis not present

## 2014-01-12 NOTE — Progress Notes (Signed)
Patient ID: Allison Cruz, female   DOB: [birth year], 58 y.o.   MRN: [id number] Pt reported to [facility] for maintenance Repetitve Transcranial Magnetic Stimulation treatment for Major Depressive Disorder. This is this pt's second session of maintenance rTMS. Pt reported no change in medication regimen, alcohol/substance use, caffeine consumption, sleep pattern, or metal implant status since previous tx.  Pt stated she felt increased energy several days last week after her first maintenance rTMS session. Pt also stated her therapist noticed improvement in her affect. Pt attributes these improvements to increasing the number of pulses per session from 3000 to 5000 for her maintenance sessions. Pt presented today with bright affect. Pt interacted positively with staff for duration of tx session, telling stories and laughing appropriately on a frequent basis. Pt completed a PHQ-9 with a score of 10 (moderate depression). This is decreased from last week's score of 12. Pt tolerated tx well. Pt experienced some facial twitching at the outset of tx. Coil angle was gradually increased from 0 degrees to +15 degrees in 5 degree incriments. At +15 degrees the pt experienced less twitching and stated the pulses were comfortable. %MT remained at 120% for the duration of tx. Pt with no complaints post-tx. Pt departed from clinic without issue.

## 2014-01-18 ENCOUNTER — Other Ambulatory Visit (INDEPENDENT_AMBULATORY_CARE_PROVIDER_SITE_OTHER): Payer: Medicare Other | Admitting: *Deleted

## 2014-01-18 VITALS — BP 102/73 | HR 84

## 2014-01-18 DIAGNOSIS — F331 Major depressive disorder, recurrent, moderate: Secondary | ICD-10-CM | POA: Diagnosis not present

## 2014-01-18 DIAGNOSIS — F332 Major depressive disorder, recurrent severe without psychotic features: Secondary | ICD-10-CM

## 2014-01-18 NOTE — Progress Notes (Signed)
Patient ID: MARLEN KOMAN, female   DOB: [birth year], 58 y.o.   MRN: [id number] Pt reported to [facility] for Repetitve Transcranial Magnetic Stimulation treatment for Major Depressive Disorder. This is the pt's third weekly maintenance tx session. Pt completed a PHQ-9 with a score of 11 (moderate depression). This is slightly increased from the previous week's score of 10 (moderate depression). Pt reported that she is having trouble deciding whether or not to move to [place], [place] to be closer to family or stay here in [place]. Pt stated she has been putting this decision off for some time, but she feels she must decide soon. Pt states "I just don't know what God wants me to do." Pt stated that she feels that this issue is contributing to her depression. Pt tolerated tx well. %MT remained at 120% for the duration of tx. Pt with no complaints post-tx. Pt departed from clinic without issue.

## 2014-01-25 ENCOUNTER — Other Ambulatory Visit (INDEPENDENT_AMBULATORY_CARE_PROVIDER_SITE_OTHER): Payer: Medicare Other | Admitting: *Deleted

## 2014-01-25 VITALS — BP 107/72 | HR 73

## 2014-01-25 DIAGNOSIS — F332 Major depressive disorder, recurrent severe without psychotic features: Secondary | ICD-10-CM | POA: Diagnosis not present

## 2014-01-25 DIAGNOSIS — F331 Major depressive disorder, recurrent, moderate: Secondary | ICD-10-CM | POA: Diagnosis not present

## 2014-01-25 NOTE — Progress Notes (Signed)
Patient ID: Allison Cruz, female   DOB: 1956-02-14, 58 y.o.   MRN: [id number] Pt reported to [facility] for maintenance Repetitve Transcranial Magnetic Stimulation treatment for Major Depressive Disorder. Pt reported that she "had the best weekend I've had in months" this past weekend. Pt reported she went out with friends Friday night, went to church Sunday morning, and went to a women's group at church Sunday night. Pt attributes these gains to her recent medication changes (d/c Brinillex and Ritalin, started Prozac 20 mg qd and Concerta CR 18 mg qd). Pt completed a PHQ-9 with a score of 11 (moderate depression). This is no change from the pt's score the previous week. Her gains from [place] appear to be maintained. Pt tolerated tx well. %MT remained at 120% for the duration of tx. Pt with no complaints post-tx. Pt departed from clinic without issue. This is the final tx session in the pt's weekly maintenance TMS regimen. Per previously established tx plan, pt will now transition to biweekly maintenance [place] sessions.

## 2014-02-05 ENCOUNTER — Telehealth (HOSPITAL_COMMUNITY): Payer: Self-pay | Admitting: *Deleted

## 2014-02-05 NOTE — Telephone Encounter (Signed)
Pt contacted Probation officer to report that, after consulting with her psychiatrist, she has decided to discontinue maintenance [place] tx sessions. Pt interested in potentially receiving a booster series of sessions in the future PRN. Writer informed pt that he will inform attending physician re: patient opting out of further maintenance [place] and will discuss possibility for booster series of sessions PRN.

## 2014-02-08 ENCOUNTER — Other Ambulatory Visit (HOSPITAL_COMMUNITY): Payer: Medicare Other

## 2014-02-22 ENCOUNTER — Other Ambulatory Visit (HOSPITAL_COMMUNITY): Payer: Medicare Other

## 2014-04-15 ENCOUNTER — Telehealth: Payer: Self-pay

## 2014-04-15 ENCOUNTER — Ambulatory Visit (INDEPENDENT_AMBULATORY_CARE_PROVIDER_SITE_OTHER): Payer: Medicare Other | Admitting: Obstetrics & Gynecology

## 2014-04-15 ENCOUNTER — Encounter: Payer: Self-pay | Admitting: Obstetrics & Gynecology

## 2014-04-15 VITALS — BP 102/62 | HR 72 | Resp 16 | Wt 184.2 lb

## 2014-04-15 DIAGNOSIS — R9230 Dense breasts, unspecified: Secondary | ICD-10-CM

## 2014-04-15 DIAGNOSIS — Z803 Family history of malignant neoplasm of breast: Secondary | ICD-10-CM

## 2014-04-15 DIAGNOSIS — R922 Inconclusive mammogram: Secondary | ICD-10-CM

## 2014-04-15 HISTORY — DX: Inconclusive mammogram: R92.2

## 2014-04-15 HISTORY — DX: Dense breasts, unspecified: R92.30

## 2014-04-15 HISTORY — DX: Family history of malignant neoplasm of breast: Z80.3

## 2014-04-15 NOTE — Telephone Encounter (Signed)
Lmtcb//kn 

## 2014-04-15 NOTE — Telephone Encounter (Signed)
Patient notified that BMD has been scheduled with [facility] on 08/11/14, but time had to be changed to 9:00am.//kn

## 2014-04-15 NOTE — Telephone Encounter (Signed)
Patient notified of all appointment information.//kn

## 2014-04-15 NOTE — Telephone Encounter (Signed)
Returning call.

## 2014-04-15 NOTE — Progress Notes (Signed)
Subjective:     Patient ID: Allison Cruz, female   DOB: May 05, 1955, 59 y.o.   MRN: [id number]  HPI 59 y.o. G47 ingleCaucasian female presents for breast exam and evaluation of bilateral breasts. Pt has a lot of anxiety about breast cancer due to mother's H/O breast cancer.  Pt has very dense breasts. Having yearly 3D MMG for screening due to this hx.  Reports breasts are sore but this is a chronic problem.  Pt feels it is due to caffeine intake.    Pt has MMG scheduled for 5/16 but needs BMD as well.  Will schedule this for her.   Review of Systems  All other systems reviewed and are negative.      Objective:   Physical Exam  Constitutional: She appears well-developed and well-nourished.  HENT:  Head: Normocephalic and atraumatic.  Neck: Normal range of motion. Neck supple. No tracheal deviation present. No thyromegaly present.  Pulmonary/Chest: Right breast exhibits no inverted nipple, no mass, no nipple discharge, no skin change and no tenderness. Left breast exhibits no inverted nipple, no mass, no nipple discharge, no skin change and no tenderness. Breasts are symmetrical.  Lymphadenopathy:    She has no cervical adenopathy.       Assessment:     Dense breasts Family hx of breast cancer  BMD with osteopenia 2013    Plan:     Exam normal today Pt has MMG scheduled for May.  Will do 3D. Will scheduled BMD, send order, and notify pt

## 2014-05-12 ENCOUNTER — Telehealth: Payer: Self-pay | Admitting: Obstetrics & Gynecology

## 2014-05-12 DIAGNOSIS — N950 Postmenopausal bleeding: Secondary | ICD-10-CM

## 2014-05-12 NOTE — Telephone Encounter (Signed)
Spoke with patient. Appointment scheduled for tomorrow at 8:45am with [name]. Patient is agreeable to date and time. Possible EMB discussed with patient. Advised patient to take 800mg  of ibuprofen or motrin one hour before appointment, eat a good breakfast, and drink plenty of fluids. Patient is agreeable. Order placed for possible EMB. Will need precert.  Cc: Felipa Emory for precert  Routing to provider for final review. Patient agreeable to disposition. Will close encounter

## 2014-05-12 NOTE — Telephone Encounter (Signed)
Patient calling to schedule an appointment for abnormal vaginal bleeding. She said, "I am bleeding and I should not be. I need to see Dr. Sabra Heck."

## 2014-05-12 NOTE — Telephone Encounter (Signed)
Spoke with patient. Patient states that over the weekend she began to have right sided pain that was radiating to her lower back. Patient was seen yesterday with internist who checked her for UTI which was negative. Patient was placed on anti-inflammatory for discomfort. Is now having brown spotting when wiping with urination. LMP 2010. "I have been to the bathroom twice this morning and it is more each time." Patient denies any discomfort at this time. Advised patient will need to be seen in office for evaluation. Patient is agreeable. Patient is available tomorrow and Friday to see [name]. Advised will need to speak with provider and return call to get her scheduled. Patient is agreeable.

## 2014-05-13 ENCOUNTER — Ambulatory Visit (INDEPENDENT_AMBULATORY_CARE_PROVIDER_SITE_OTHER): Payer: Medicare Other | Admitting: Obstetrics & Gynecology

## 2014-05-13 ENCOUNTER — Encounter: Payer: Self-pay | Admitting: Obstetrics & Gynecology

## 2014-05-13 VITALS — BP 102/62 | HR 68 | Temp 98.2°F | Resp 16 | Wt 180.8 lb

## 2014-05-13 DIAGNOSIS — R312 Other microscopic hematuria: Secondary | ICD-10-CM

## 2014-05-13 DIAGNOSIS — N950 Postmenopausal bleeding: Secondary | ICD-10-CM

## 2014-05-13 DIAGNOSIS — R109 Unspecified abdominal pain: Secondary | ICD-10-CM

## 2014-05-13 DIAGNOSIS — R3129 Other microscopic hematuria: Secondary | ICD-10-CM

## 2014-05-13 DIAGNOSIS — N3000 Acute cystitis without hematuria: Secondary | ICD-10-CM | POA: Diagnosis not present

## 2014-05-13 LAB — POCT URINALYSIS DIPSTICK
Bilirubin, UA: NEGATIVE
Glucose, UA: NEGATIVE
Ketones, UA: NEGATIVE
Leukocytes, UA: NEGATIVE
Nitrite, UA: NEGATIVE
Urobilinogen, UA: NEGATIVE
pH, UA: 5

## 2014-05-13 NOTE — Progress Notes (Signed)
Patient verbalized understanding of the [facility] appointment cancellation policy. Advised will need to cancel or reschedule within 72 business hours of appointment (3 business days) or will have $100.00 late cancellation fee placed to account.  Scheduled for 06/03/14 at 1430

## 2014-05-13 NOTE — Progress Notes (Signed)
Subjective:     Patient ID: Allison Cruz, female   DOB: 09-11-1955, 59 y.o.   MRN: [id number]  HPI 59 yo G0 SWF here for complaint of RLQ pain and vaginal bleeding.  Pt reported this started on 05/09/14 as RLQ pain with radiation into her back.  She used a heating pad with some success.  Monday and Tuesday the pain was still the same except pain was exacerbated by the heat.  05/11/14 pt saw PCP, Dr. Minna Antis.  He thought it was possibly a back spasm and accupuncture was done in office.  This didn't help.  Anti-inflammatories really didn't help either.  Pt reports she did have some bloating as well as discomfort across the front of her abdomen.    Pt states then yesterday she has dark spotting all day long.  The pain improved significantly once the bleeding started.  Pt did have some dysuria off and on during the week.  Yesterday, essentially everything "felt better".  Pain is just achy now and she doesn't feel like she needs anything for pain now.  No fevers.  No bowel changes.   Review of Systems  All other systems reviewed and are negative.      Objective:   Physical Exam  Constitutional: She appears well-developed and well-nourished.  Abdominal: Soft. Bowel sounds are normal.  Genitourinary: Uterus normal. There is no rash, tenderness or lesion on the right labia. There is no rash, tenderness or lesion on the left labia. Cervix exhibits no motion tenderness and no discharge. Right adnexum displays no mass, no tenderness and no fullness. Left adnexum displays no mass, no tenderness and no fullness. Bleeding: scant blood at cervix presaent.  Lymphadenopathy:       Right: No inguinal adenopathy present.       Left: No inguinal adenopathy present.  Skin: Skin is warm and dry.  Psychiatric: She has a normal mood and affect.   Endometrial biopsy recommended.  Discussed with patient.  Verbal and written consent obtained.   Procedure:  Speculum placed.  Cervix visualized and cleansed with betadine  prep.  A single toothed tenaculum was applied to the anterior lip of the cervix.  Dilation of cervix was necessary.  Endometrial pipelle was advanced through the cervix into the endometrial cavity without difficulty.  Pipelle passed to 6cm.  Suction applied and pipelle removed with scant tissue sample obtained.  Three passes performed.  Tenculum removed.  No bleeding noted.  Patient tolerated procedure well.     Assessment:     RLQ pain/flank pain Microscopic hematuria PMP bleeding     Plan:     Biopsy pending Plan PUS Urine micro and urine culture

## 2014-05-13 NOTE — Addendum Note (Signed)
Addended by: Michele Mcalpine on: 05/13/2014 10:14 AM   Modules accepted: Orders

## 2014-05-14 ENCOUNTER — Telehealth: Payer: Self-pay | Admitting: Obstetrics & Gynecology

## 2014-05-14 LAB — URINALYSIS, MICROSCOPIC ONLY: Squamous Epithelial / LPF: NONE SEEN

## 2014-05-14 NOTE — Telephone Encounter (Signed)
Left message for patient to call back. Need to go over benefits for upcoming PUS.

## 2014-05-16 LAB — URINE CULTURE: Colony Count: >=100000

## 2014-05-16 MED ORDER — NITROFURANTOIN MONOHYD MACRO 100 MG PO CAPS: 100.0000 mg | ORAL_CAPSULE | Freq: Two times a day (BID) | ORAL | Status: DC

## 2014-05-16 NOTE — Addendum Note (Signed)
Addended by: [name] on: 05/16/2014 10:30 PM   Modules accepted: Orders

## 2014-06-01 ENCOUNTER — Telehealth: Payer: Self-pay | Admitting: Obstetrics & Gynecology

## 2014-06-01 NOTE — Telephone Encounter (Signed)
Patient has a PUS appointment 06/03/14 and is asking if she needs to anything to prepare for this PUS. Lasts seen 05/13/14.

## 2014-06-01 NOTE — Telephone Encounter (Signed)
Spoke with patient. Advised patient she does not need to do anything extra in preparation for PUS on 06/03/2014. Advised to arrive to appointment 15 minutes early for check in and to update any paper work. Patient is agreeable.  Routing to provider for final review. Patient agreeable to disposition. Will close encounter.

## 2014-06-03 ENCOUNTER — Ambulatory Visit (INDEPENDENT_AMBULATORY_CARE_PROVIDER_SITE_OTHER): Payer: Medicare Other

## 2014-06-03 ENCOUNTER — Ambulatory Visit (INDEPENDENT_AMBULATORY_CARE_PROVIDER_SITE_OTHER): Payer: Medicare Other | Admitting: Obstetrics & Gynecology

## 2014-06-03 VITALS — BP 100/62 | Ht 65.0 in | Wt 176.0 lb

## 2014-06-03 DIAGNOSIS — N3000 Acute cystitis without hematuria: Secondary | ICD-10-CM

## 2014-06-03 DIAGNOSIS — R140 Abdominal distension (gaseous): Secondary | ICD-10-CM | POA: Diagnosis not present

## 2014-06-03 DIAGNOSIS — N950 Postmenopausal bleeding: Secondary | ICD-10-CM | POA: Diagnosis not present

## 2014-06-03 DIAGNOSIS — R109 Unspecified abdominal pain: Secondary | ICD-10-CM | POA: Diagnosis not present

## 2014-06-03 NOTE — Progress Notes (Signed)
59 y.o. G0 Singlefemale here for a pelvic ultrasound due to episode of right sided pelvic pain that radiated to back.  Pt also experienced increased abdominal bloating and discomfort.  At same time, pt had some browning vaginal bleeding.  She was first seen on 05/13/14 for this and an endometrial biopsy was performed.  This was negative.  Urine culture was also obtained and was positive.  Pt was treated and symptoms fully resolved.  Pt was called and advised ultrasound could be cancelled.  Pt does have a lot of anxiety and requested that we proceed as planned.  She is here for this today.  Patient's last menstrual period was 12/31/2008.  Sexually active:  yes  Contraception: PMP state  FINDINGS: UTERUS: 4.1 x 2.0 x 2.0cm EMS: 1.24mm ADNEXA:   Left ovary 1.5 x 0.7 x 0.7cm   Right ovary 1.8 x 1.1 x 1.2cm CUL DE SAC: no free fluid  Images reviewed with pt.  Normal findings discussed.  At this time, do not feel any additional evaluation is needed.  Pt reports her 78+ year old dog is at the veterinarian with a new lung mass and is being evaluated.  She is very sad about this and knows she may have to put her dog to sleep.  Pt has never been married and has no children.  Tearful.  Support offered.    Assessment:  Flank pain, pelvic pain, abdominal bloating, recent UTI Plan: Pt will follow up for AEX in July.  Pt comes every six months for breast exam/AEX due to anxiety and mother's hx of breast cancer.  All questions answered.    ~15 minutes spent with patient >50% of time was in face to face discussion of above.

## 2014-06-05 LAB — URINE CULTURE
Colony Count: NO GROWTH
Organism ID, Bacteria: NO GROWTH

## 2014-06-07 ENCOUNTER — Encounter: Payer: Self-pay | Admitting: Obstetrics & Gynecology

## 2014-06-10 ENCOUNTER — Other Ambulatory Visit: Payer: BC Managed Care – PPO | Admitting: Obstetrics & Gynecology

## 2014-06-10 ENCOUNTER — Other Ambulatory Visit: Payer: BC Managed Care – PPO

## 2014-06-15 ENCOUNTER — Telehealth (HOSPITAL_COMMUNITY): Payer: Self-pay | Admitting: *Deleted

## 2014-06-15 NOTE — Telephone Encounter (Signed)
Called pt to conduct post-tx follow up re: level of depression in connection with Transcranial Magnetic Stimulation treatment. Pt reported that she has not been doing well, and that "it was a tough January and February." Pt reported that she has been having some medical issues that have exacerbated her depression and made it difficult to remain in psychotherapy. Pt continues to see her psychiatrist, and med changes are made frequently to dial in a regimen that is therapeutic for pt's depression. Writer administered a PHQ-9 over the phone, with pt scoring a 16 (moderately severe depression). This is increased from pt's previous score of 14 last December. Pt endorsed passive SI without plan or intent to harm herself. Writer encouraged pt to contact [facility] staff if she decides she would like to pursue another course of [place] tx, as she responded to previous course of tx. Pt said she would.

## 2014-08-23 ENCOUNTER — Telehealth: Payer: Self-pay

## 2014-08-23 NOTE — Telephone Encounter (Signed)
Patient notified of BMD results. Copy will be scanned in Epic. States has her AEX scheduled 10/26/14 and is it ok to discuss at that time? Please advise. Aware may need two separate appointments since it could be a lengthy discussion.//kn

## 2014-08-23 NOTE — Telephone Encounter (Signed)
lmtcb-to discuss [facility] BMD results-results on KN's desk.//kn

## 2014-08-24 NOTE — Telephone Encounter (Signed)
Separate appointment made for BMD.//kn

## 2014-08-24 NOTE — Telephone Encounter (Signed)
Please schedule separate consult visit.

## 2014-09-20 ENCOUNTER — Ambulatory Visit (INDEPENDENT_AMBULATORY_CARE_PROVIDER_SITE_OTHER): Payer: BC Managed Care – PPO | Admitting: Obstetrics & Gynecology

## 2014-09-20 VITALS — BP 98/62 | HR 64 | Resp 16 | Wt 163.0 lb

## 2014-09-20 DIAGNOSIS — M810 Age-related osteoporosis without current pathological fracture: Secondary | ICD-10-CM

## 2014-10-05 ENCOUNTER — Encounter: Payer: Self-pay | Admitting: Obstetrics & Gynecology

## 2014-10-05 DIAGNOSIS — M810 Age-related osteoporosis without current pathological fracture: Secondary | ICD-10-CM

## 2014-10-05 HISTORY — DX: Age-related osteoporosis without current pathological fracture: M81.0

## 2014-10-05 NOTE — Progress Notes (Signed)
Subjective:    40 yrs Single Caucasian G0P0  female here to discuss recent BMD obtained 08/11/14 showing osteoporosis with T score -2.5 located in right femoral neck.     Osteoporosis Risk Factors  Nonmodifiable Personal Hx of fracture as an adult: no Hx of fracture in first-degree relative: no Caucasian race: yes Advanced age: no Female sex: yes Dementia: no Poor health/frailty: no  Potentially modifiable: Tobacco use: no Low body weight (<127 lbs): no Estrogen deficiency  early menopause (age <45) or bilateral ovariectomy: no  prolonged premenopausal amenorrhea (>1 yr): no Low calcium intake (lifelong): yes Alcohol use more than 2 drinks per day: no Recurrent falls: no Inadequate physical activity: yes  Current calcium and Vit D intake:  500mg  of calcium and Vit D 50,000 IU every 14 days  Review of Systems A comprehensive review of systems was negative.     Objective:   PHYSICAL EXAM BP 98/62 mmHg  Pulse 64  Resp 16  Wt 163 lb (73.936 kg)  LMP 12/31/2008 General appearance: alert, cooperative and appears stated age  Imaging Bone Density obtained 08/11/14 FRAX score:  10 year probability of hip fracture: 2.0% percent.                        10-year probability of major osteoporotic fractures combined is 10.8% percent.                                         Assessment:   Osteoporosis with T score -2.5   Plan:   Pt really not interested in medication right now and has started a much more rigorous exercise program at this time.  She would like to hold off on medications if possible.  As she is taking calcium and Vit D as well as doing weight bearing exercise at least three times weekly, I feel ok to wait and watch.  Will plan to repeat BMD in two years.  Pt in agreement with plan.   ~15 minutes spent with patient >50% of time was in face to face discussion of above.

## 2014-10-26 ENCOUNTER — Encounter: Payer: Self-pay | Admitting: Obstetrics & Gynecology

## 2014-10-26 ENCOUNTER — Ambulatory Visit (INDEPENDENT_AMBULATORY_CARE_PROVIDER_SITE_OTHER): Payer: BC Managed Care – PPO | Admitting: Obstetrics & Gynecology

## 2014-10-26 VITALS — BP 118/72 | HR 70 | Resp 14 | Ht 64.5 in | Wt 156.8 lb

## 2014-10-26 DIAGNOSIS — Z124 Encounter for screening for malignant neoplasm of cervix: Secondary | ICD-10-CM

## 2014-10-26 DIAGNOSIS — Z0000 Encounter for general adult medical examination without abnormal findings: Secondary | ICD-10-CM | POA: Diagnosis not present

## 2014-10-26 DIAGNOSIS — L659 Nonscarring hair loss, unspecified: Secondary | ICD-10-CM

## 2014-10-26 DIAGNOSIS — Z01419 Encounter for gynecological examination (general) (routine) without abnormal findings: Secondary | ICD-10-CM | POA: Diagnosis not present

## 2014-10-26 LAB — POCT URINALYSIS DIPSTICK
Bilirubin, UA: NEGATIVE
Blood, UA: NEGATIVE
Glucose, UA: NEGATIVE
Ketones, UA: NEGATIVE
Leukocytes, UA: NEGATIVE
Nitrite, UA: NEGATIVE
Protein, UA: NEGATIVE
Urobilinogen, UA: NEGATIVE
pH, UA: 5

## 2014-10-26 NOTE — Progress Notes (Signed)
59 y.o. G0P0 SingleCaucasianF here for annual exam.  No vaginal bleeding.  Mother is doing well.  She is grieving the loss of her dog.  Does report some rectal bleeding.  Has noticed it with BMs when having constipation.  Has noted some rectal discomfort.   Has experienced some hair loss.  Had blood work done with Dr. Minna Antis.  Has lost weight.  Working on this as well.  Did have a medication change that she feels has made a big difference.  #28 pounds down since January.  Patient's last menstrual period was 12/31/2008.          Sexually active: No.  The current method of family planning is none.    Exercising: Yes.    cardio and weight bearing Smoker:  no  Health Maintenance: Pap:  10/12/13 WNL, neg HR HPV 2013 History of abnormal Pap:  no MMG:  08/11/14 3D Bi-rads C 1 neg. Colonoscopy:  2013 f/u up in 10 years BMD:   08/11/14 osteoporosis TDaP:  2012 Screening Labs: PCP, Hb today: PCP, Urine today: Neg.   reports that she has never smoked. She has never used smokeless tobacco. She reports that she does not drink alcohol or use illicit drugs.  Past Medical History  Diagnosis Date  . Anxiety   . Depression   . Renal cyst   . CFS (chronic fatigue syndrome)   . Basal cell carcinoma of face     forehead  . Tachycardia     negative stress test  . Hematuria     neg eval Dr. Joelyn Oms, 2000  . Sciatica     bulging L5-S1    Past Surgical History  Procedure Laterality Date  . Mohs surgery  9/08    Dr Eula Fried    Current Outpatient Prescriptions  Medication Sig Dispense Refill  . aspirin EC 81 MG tablet Take 81 mg by mouth daily.    . B Complex Vitamins (VITAMIN B COMPLEX PO) Take by mouth daily.    . Bisoprolol Fumarate (ZEBETA PO) Take by mouth.    Marland Kitchen buPROPion (WELLBUTRIN XL) 300 MG 24 hr tablet     . Calcium Carb-Cholecalciferol (CALCIUM 1000 + D PO) Take by mouth.    . clonazePAM (KLONOPIN) 1 MG tablet Take 1-2 mg by mouth 2 (two) times daily as needed for anxiety.    . Coenzyme  Q10 (CO Q 10 PO) Take by mouth.    . fish oil-omega-3 fatty acids 1000 MG capsule Take 2 g by mouth daily.    Marland Kitchen FLUoxetine (PROZAC) 40 MG capsule Take 40 mg by mouth daily.    . methylphenidate 18 MG PO CR tablet Take 18 mg by mouth daily.    . nitrofurantoin, macrocrystal-monohydrate, (MACROBID) 100 MG capsule Take 1 capsule (100 mg total) by mouth 2 (two) times daily. 14 capsule 0  . promethazine (PHENERGAN) 25 MG tablet as needed.     . rizatriptan (MAXALT) 10 MG tablet as needed.    . Vitamin D, Ergocalciferol, (DRISDOL) 50000 UNITS CAPS capsule Every other week    . zolpidem (AMBIEN) 10 MG tablet 10 mg daily.     No current facility-administered medications for this visit.    Family History  Problem Relation Age of Onset  . Breast cancer Mother 70    her 2 neu+  . Hypertension Mother   . Heart disease Maternal Grandmother   . Heart disease Paternal Grandmother   . Heart Problems Mother     pacemaker placed  .  Colon cancer Father 45       . Breast cancer Maternal Aunt     maternal great aunt    ROS:  Pertinent items are noted in HPI.  Otherwise, a comprehensive ROS was negative.  Exam:   General appearance: alert, cooperative and appears stated age Head: Normocephalic, without obvious abnormality, atraumatic Neck: no adenopathy, supple, symmetrical, trachea midline and thyroid normal to inspection and palpation Lungs: clear to auscultation bilaterally Breasts: normal appearance, no masses or tenderness Heart: regular rate and rhythm Abdomen: soft, non-tender; bowel sounds normal; no masses,  no organomegaly Extremities: extremities normal, atraumatic, no cyanosis or edema Skin: Skin color, texture, turgor normal. No rashes or lesions Lymph nodes: Cervical, supraclavicular, and axillary nodes normal. No abnormal inguinal nodes palpated Neurologic: Grossly normal   Pelvic: External genitalia:  no lesions              Urethra:  normal appearing urethra with no masses,  tenderness or lesions              Bartholins and Skenes: normal                 Vagina: normal appearing vagina with normal color and discharge, no lesions              Cervix: no lesions              Pap taken: Yes.   Bimanual Exam:  Uterus:  normal size, contour, position, consistency, mobility, non-tender              Adnexa: normal adnexa and no mass, fullness, tenderness               Rectovaginal: Confirms               Anus:  normal sphincter tone, no lesions, firmness at 7 o'clock at spincter  Chaperone was present for exam.  A:  Well Woman with normal exam Long history of depression, H/O ECT, has been on multiple medications over the years for this  Family hx of breast cancer in mother Dense breasts Weight loss Rectal bleeding/firmness at 7 o'clock  P: Mammogram yearly.  Having follow up breast exams every six months. Will do blood work with Dr. [name].  Appt is scheduled. Referral to [facility] dermatology   Will have pt see Dr. Collene Mares again to check rectal finding.  Normal colonoscopy 2013. Recheck breasts 6 months. AEX 1 year.

## 2014-10-26 NOTE — Progress Notes (Signed)
Scheduled patient while in office for appointment with Dr.Mann on 11/09/2014 at 9:45am. Patient is agreeable to date and time.

## 2014-10-27 LAB — IPS PAP TEST WITH REFLEX TO HPV

## 2014-10-28 ENCOUNTER — Telehealth: Payer: Self-pay

## 2014-10-28 NOTE — Telephone Encounter (Signed)
Lmtcb//kn 

## 2015-04-28 ENCOUNTER — Ambulatory Visit (INDEPENDENT_AMBULATORY_CARE_PROVIDER_SITE_OTHER): Payer: BC Managed Care – PPO | Admitting: Obstetrics & Gynecology

## 2015-04-28 ENCOUNTER — Encounter: Payer: Self-pay | Admitting: Obstetrics & Gynecology

## 2015-04-28 VITALS — BP 96/66 | HR 82 | Resp 16 | Ht 64.5 in | Wt 176.0 lb

## 2015-04-28 DIAGNOSIS — R922 Inconclusive mammogram: Secondary | ICD-10-CM

## 2015-04-28 DIAGNOSIS — N644 Mastodynia: Secondary | ICD-10-CM | POA: Diagnosis not present

## 2015-04-28 NOTE — Patient Instructions (Signed)
Allison Cruz, [name]

## 2015-04-28 NOTE — Progress Notes (Signed)
Subjective:     Patient ID: Allison Cruz, female   DOB: 05/12/1955, 60 y.o.   MRN: [id number]  HPI 60 yo G0P0 SWF here for breast check.  Denies any breast changes.  No nipple discharge.  Continues to have right breast tenderness.  Can be worse at times but typically always present.    Pt reports she's going through a medication change.  Was on Riexulti but this caused a mouth tick (that can be permanent), so she stopped it as well.  Now on Deplin, Prozac, and Wellbutrin SR.  Has gained 20 pounds because she just doesn't feel like going to the gym to exercise.  Has questions about new PCP.  Saw Dr. Jani Gravel after Dr. Minna Antis moved to [place].  Just doesn't feel it was a good fit.  Review of Systems  Psychiatric/Behavioral:       Depression      Objective:   Physical Exam  Constitutional: She is oriented to person, place, and time. She appears well-developed and well-nourished.  Neck: Normal range of motion. Neck supple. No tracheal deviation present. No thyromegaly present.  Pulmonary/Chest: Right breast exhibits tenderness. Right breast exhibits no inverted nipple, no mass, no nipple discharge and no skin change. Left breast exhibits no inverted nipple, no mass, no nipple discharge, no skin change and no tenderness. Breasts are symmetrical.    Lymphadenopathy:    She has no cervical adenopathy.  Neurological: She is alert and oriented to person, place, and time.  Skin: Skin is warm and dry.  Psychiatric: She has a normal mood and affect.       Assessment:     Dense breasts with RUOQ breast tenderness     Plan:     Pt will continue 3D mammography and return in August for her AEX

## 2015-08-12 ENCOUNTER — Other Ambulatory Visit: Payer: Self-pay | Admitting: Obstetrics & Gynecology

## 2015-08-12 ENCOUNTER — Telehealth: Payer: Self-pay

## 2015-08-12 ENCOUNTER — Encounter: Payer: Self-pay | Admitting: Obstetrics & Gynecology

## 2015-08-12 ENCOUNTER — Other Ambulatory Visit (INDEPENDENT_AMBULATORY_CARE_PROVIDER_SITE_OTHER): Payer: BC Managed Care – PPO

## 2015-08-12 ENCOUNTER — Ambulatory Visit
Admission: RE | Admit: 2015-08-12 | Discharge: 2015-08-12 | Disposition: A | Discharge status: Home / Self Care | Payer: Medicare Other | Source: Ambulatory Visit | Attending: Internal Medicine | Admitting: Internal Medicine

## 2015-08-12 ENCOUNTER — Other Ambulatory Visit: Payer: Self-pay | Admitting: Internal Medicine

## 2015-08-12 DIAGNOSIS — R51 Headache: Principal | ICD-10-CM

## 2015-08-12 DIAGNOSIS — R519 Headache, unspecified: Secondary | ICD-10-CM

## 2015-08-12 DIAGNOSIS — R3129 Other microscopic hematuria: Secondary | ICD-10-CM

## 2015-08-12 NOTE — Telephone Encounter (Signed)
Telephone encounter created to discuss mychart message with [name]. 

## 2015-08-12 NOTE — Telephone Encounter (Signed)
It is fine for her to come and give a urine same for micro and culture.  Order placed.  PCP suggestion:  Billey Gosling at [facility] on [address].

## 2015-08-12 NOTE — Telephone Encounter (Signed)
Spoke with patient. Advised of message as seen below from [place]. Patient is agreeable and verbalizes understanding. She will come by the office today at 2 pm to give a urine sample. Denies any urinary symptoms, lower back pain, fevers or chills at this time. Information provided to PCP Billey Gosling. She will contact [place] at [facility] to schedule an appointment.  Routing to provider for final review. Patient agreeable to disposition. Will close encounter.

## 2015-08-12 NOTE — Telephone Encounter (Signed)
Non-Urgent Medical Question  Message [id number]   From  Allison Cruz   To  Allison Salon, MD   Sent  08/12/2015 10:27 AM     Dr. Sabra Heck. This is [facility]. I think you told me I could check in with you here. I am in the midst of a major depressive episode. It. Is. Hell. Getting treatment for last month. I had blood work done and it came back with trace of blood in urine sample. Dr. [name] did not seem concerned about it. But since I have had UTI before I am concerned they should have done another sample and culture. I just don't feel as comfortable as I did with Dr. Minna Antis talking. Do you think I should go get a sample taken somewhere? You told me about a Dr. [name] (female) as my new dr. She is not taking new patients. Do you have another recommendation? You mentioned a dr in [place] you liked a lot. I really don't want to have to drive there. But if you recommend her as the best for me I trust your recommendation. Having Cat Scan today because of headaches.Any direction you could give me I would so appreciate. Hope you and family doing wel and enjoying your spring. I so appreciate you. Thank you. Allison Cruz      Responsible Party    Pool - [facility] Clinical Pool No one has taken responsibility for this message.     No actions have been taken on this message.     Routing to [place] for review and advise.

## 2015-08-13 LAB — URINALYSIS, MICROSCOPIC ONLY
Bacteria, UA: NONE SEEN [HPF]
Casts: NONE SEEN [LPF]
Crystals: NONE SEEN [HPF]
RBC / HPF: NONE SEEN RBC/HPF (ref <=2)
Squamous Epithelial / LPF: NONE SEEN [HPF] (ref <=5)
WBC, UA: NONE SEEN WBC/HPF (ref <=5)
Yeast: NONE SEEN [HPF]

## 2015-08-13 LAB — URINE CULTURE: Colony Count: 75000

## 2015-08-18 ENCOUNTER — Encounter (HOSPITAL_COMMUNITY): Payer: Self-pay | Admitting: Psychiatry

## 2015-08-18 ENCOUNTER — Other Ambulatory Visit (HOSPITAL_COMMUNITY): Payer: Medicare Other | Attending: Psychiatry | Admitting: Psychiatry

## 2015-08-18 DIAGNOSIS — F332 Major depressive disorder, recurrent severe without psychotic features: Secondary | ICD-10-CM | POA: Insufficient documentation

## 2015-08-18 MED ORDER — DEXTROAMPHETAMINE SULFATE 10 MG PO TABS: 10.0000 mg | ORAL_TABLET | Freq: Two times a day (BID) | ORAL | Status: DC

## 2015-08-18 NOTE — Addendum Note (Signed)
Addended by: Sherlyn Hay on: 08/18/2015 04:41 PM   Modules accepted: Medications
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
## 2015-08-18 NOTE — Progress Notes (Signed)
    Daily Group Progress Note  Program: IOP  Group Time: 9:00-10:30  Participation Level: Active  Behavioral Response: Appropriate  Type of Therapy:  Group Therapy  Summary of Progress: Pt. Met with case manager and psychiatrist to complete intake assessment due to first day in group.     Group Time: 10:30-12:00  Participation Level:  Active  Behavioral Response: Appropriate  Type of Therapy: Psycho-education Group  Summary of Progress: Pt. Participated in discussion about development of radical acceptance of self, including acceptance of mental health conditions, to help with the management of distressing symptoms associated with the condition.  Eloise Levels, Ph.D., [facility]

## 2015-08-18 NOTE — Addendum Note (Signed)
Addended by: Donnelly Angelica D on: 08/18/2015 02:30 PM   Modules accepted: Orders, Medications
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
## 2015-08-19 ENCOUNTER — Other Ambulatory Visit (HOSPITAL_COMMUNITY): Payer: Medicare Other | Admitting: Psychiatry

## 2015-08-19 DIAGNOSIS — F332 Major depressive disorder, recurrent severe without psychotic features: Secondary | ICD-10-CM | POA: Diagnosis not present

## 2015-08-22 ENCOUNTER — Other Ambulatory Visit (HOSPITAL_COMMUNITY): Payer: Medicare Other | Admitting: Licensed Clinical Social Worker

## 2015-08-22 DIAGNOSIS — F332 Major depressive disorder, recurrent severe without psychotic features: Secondary | ICD-10-CM

## 2015-08-22 NOTE — Progress Notes (Signed)
    Daily Group Progress Note  Program: IOP  Group Time: 9-10:45 am  Participation Level: Active  Behavioral Response: Appropriate  Type of Therapy:  Psycho-education Group  Summary of Progress: Pt participated in a group facilitated by [facility] Pharmacist on medication.     Group Time: 10:45-12:00pm  Participation Level:  Active  Behavioral Response: Appropriate  Type of Therapy: Group Therapy  Summary of Progress: Pt presented flat but was appropriate during process. She appeared open and honest in her discussion on her depression which she has experienced for the last 10 years. She reports "Depression is a thief. It has taken so much away in my life."   MACKENZIE,LISBETH S, Licensed Cli

## 2015-08-23 ENCOUNTER — Other Ambulatory Visit (HOSPITAL_COMMUNITY): Payer: Medicare Other | Admitting: Licensed Clinical Social Worker

## 2015-08-23 DIAGNOSIS — F332 Major depressive disorder, recurrent severe without psychotic features: Secondary | ICD-10-CM

## 2015-08-23 NOTE — Progress Notes (Signed)
    Daily Group Progress Note  Program: IOP  Group Time: 10:45-12:00  Participation Level: Active  Behavioral Response: Appropriate  Type of Therapy:  Psycho-education Group  Summary of Progress: Pt participated in a discussion on "What does my depression personally look like." Pt participated in activity on identifying personal depression symptoms. Patient shared, "My depression is hell." She reports she has a list of many symptoms of her depression.  The pt also participated in a discussion about "You are not your depression."         Kenzey Birkland S, Licensed Cli

## 2015-08-23 NOTE — Progress Notes (Signed)
    Daily Group Progress Note  Program: IOP  Group Time: 9:00-10:30  Participation Level: Active  Behavioral Response: Appropriate  Type of Therapy:  Group Therapy  Summary of Progress: Pt. Presents with flat affect, talkative, engaged in group process. Pt. Discussed history of isolating and fears that she has lost relationships with her best friends. Pt. Stated "I wasn't missed" by friends. Pt. Worked with the group to challenge the personalization of this thought and recognize that often friends and loved ones do not have the skills to appropriately connect when someone is living with depression.     Group Time: 10:30-12:00  Participation Level:  Active  Behavioral Response: Appropriate  Type of Therapy: Psycho-education Group  Summary of Progress: Pt. Participated in grief and loss group with the Chaplain.  Nancie Neas, LPC

## 2015-08-24 ENCOUNTER — Other Ambulatory Visit (HOSPITAL_COMMUNITY): Payer: Medicare Other

## 2015-08-24 NOTE — Progress Notes (Signed)
    Daily Group Progress Note  Program: IOP  Group Time: 9:00-10:30  Participation Level: Active  Behavioral Response: Appropriate  Type of Therapy:  Group Therapy  Summary of Progress: Pt. Presented with brightened affect, talkative, engaged in the group process. Pt. Discussed grief related to loss of dog nearly one year ago. Pt. Shared with the group her progress with ketamine treatments and that she would be absent tomorrow due to scheduled treatment.

## 2015-08-25 ENCOUNTER — Other Ambulatory Visit (HOSPITAL_COMMUNITY): Payer: Medicare Other | Admitting: Psychiatry

## 2015-08-25 DIAGNOSIS — F332 Major depressive disorder, recurrent severe without psychotic features: Secondary | ICD-10-CM

## 2015-08-26 ENCOUNTER — Other Ambulatory Visit (HOSPITAL_COMMUNITY): Payer: Medicare Other | Admitting: Psychiatry

## 2015-08-26 DIAGNOSIS — F332 Major depressive disorder, recurrent severe without psychotic features: Secondary | ICD-10-CM

## 2015-08-26 NOTE — Progress Notes (Signed)
    Daily Group Progress Note  Program: IOP  Group Time: 9:00-12:00  Participation Level: Active  Behavioral Response: Appropriate  Type of Therapy:  Group Therapy  Summary of Progress: Pt. Presented as mildly anxious. Pt. Discussed her fears about filling time over long holiday weekend. Pt. Also discussed her anxiety about attending her niece's graduation from high school. Pt. Made the decision to attend the graduation and discussed techniques that she can use to manage her anxiety including bilateral tapping and 5-4-3-2-1.      Nancie Neas, LPC

## 2015-08-26 NOTE — Progress Notes (Signed)
    Daily Group Progress Note  Program: IOP  Group Time: 9:00-12:00  Participation Level: Active  Behavioral Response: Appropriate  Type of Therapy:  Group Therapy  Summary of Progress: Pt. shared her experience with Ketamine injections.  She also shared her progress with her depression; she was able to reach out to old friends and share her history of depression with them.  She stated this was a good experience even though it forced her "out of her comfort zone" and made her vulnerable.  She did experience anxiety during the encounter, but was able to manage it.       Nancie Neas, LPC

## 2015-08-30 ENCOUNTER — Other Ambulatory Visit (HOSPITAL_COMMUNITY): Payer: Medicare Other | Admitting: Psychiatry

## 2015-08-30 DIAGNOSIS — F332 Major depressive disorder, recurrent severe without psychotic features: Secondary | ICD-10-CM | POA: Diagnosis not present

## 2015-08-30 DIAGNOSIS — F331 Major depressive disorder, recurrent, moderate: Secondary | ICD-10-CM

## 2015-08-30 NOTE — Progress Notes (Signed)
    Daily Group Progress Note  Program: IOP  Group Time: 10:30-12:00pm  Participation Level: Active  Behavioral Response: Appropriate  Type of Therapy:  Group Therapy  Summary of Progress: Pt presented with flat affect. She shared she had a "bad weekend" and didn't get out of her pjs the whole weekend. She was supposed to go to her neices' graduation but just didn't feel up to it. Her brother sent her a tape of the graduation and she was able to "face time" with her niece. She shares she is having anxiety today because she is due for her next infusion tomorrow and fears the dr will stop the infusions and suggest ECT. The pt shared with the group she does not want ECT due to the memory loss that is a side-effect. Another group member was able to allay her fears somewhat with the discussion of risks/benefits of ECT. Pt feels that she has lost hope and doesn't know what to do differently. Other pts in the group gave her positive feedback on "working through the depression, not just expecting it."     [name] S, Licensed Cli

## 2015-08-31 ENCOUNTER — Other Ambulatory Visit (HOSPITAL_COMMUNITY): Payer: Medicare Other | Admitting: Psychiatry

## 2015-08-31 ENCOUNTER — Telehealth (HOSPITAL_COMMUNITY): Payer: Self-pay | Admitting: Psychiatry

## 2015-08-31 DIAGNOSIS — F411 Generalized anxiety disorder: Secondary | ICD-10-CM | POA: Insufficient documentation

## 2015-08-31 HISTORY — DX: Generalized anxiety disorder: F41.1

## 2015-08-31 NOTE — Progress Notes (Signed)
    Daily Group Progress Note  Program: IOP  Group Time: 9:00-10:30  Participation Level: Active  Behavioral Response: Appropriate  Type of Therapy:  Group Therapy  Summary of Progress: Pt. Presented as depressed, flat affect, poor eye contact. Pt. Shared that she had a very bad weekend because she had disappointed her family again by not being able to attend her niece's graduation due to her depression. Pt. Expressed disappointment with her self. Pt. Was encouraged by the group to exercise acceptance of her self and practice self-compassion to cope with the pain.   Eloise Levels, Ph.D., [facility]

## 2015-09-01 ENCOUNTER — Other Ambulatory Visit (HOSPITAL_COMMUNITY): Payer: Medicare Other | Admitting: Psychiatry

## 2015-09-01 NOTE — Telephone Encounter (Signed)
D:  Pt phoned and left vm that she wouldn't be attending [facility]-IOP today due to having a stress test done today at [facility].  According to pt, she started having chest pain and dizziness yesterday after her Ketamine Treatment.  A:  Inform treatment team.

## 2015-09-02 ENCOUNTER — Other Ambulatory Visit (HOSPITAL_COMMUNITY): Payer: Medicare Other | Admitting: Psychiatry

## 2015-09-05 ENCOUNTER — Other Ambulatory Visit (HOSPITAL_COMMUNITY): Payer: Medicare Other | Admitting: Psychiatry

## 2015-09-06 ENCOUNTER — Other Ambulatory Visit (HOSPITAL_COMMUNITY): Payer: Medicare Other | Attending: Psychiatry | Admitting: Licensed Clinical Social Worker

## 2015-09-06 DIAGNOSIS — F332 Major depressive disorder, recurrent severe without psychotic features: Secondary | ICD-10-CM | POA: Insufficient documentation

## 2015-09-06 DIAGNOSIS — F331 Major depressive disorder, recurrent, moderate: Secondary | ICD-10-CM

## 2015-09-07 ENCOUNTER — Other Ambulatory Visit (HOSPITAL_BASED_OUTPATIENT_CLINIC_OR_DEPARTMENT_OTHER): Payer: Medicare Other | Admitting: Licensed Clinical Social Worker

## 2015-09-07 DIAGNOSIS — F331 Major depressive disorder, recurrent, moderate: Secondary | ICD-10-CM

## 2015-09-07 NOTE — Progress Notes (Signed)
    Daily Group Progress Note  Program: IOP  Group Time: 9:00-10:30  Participation Level: Active  Behavioral Response: Appropriate  Type of Therapy:  Group Therapy  Summary of Progress: Pt. Presented as engaged in the group process, expressed concerns about recent anxiety during ketamine infusion, fears about effectively managing her depression. Participated in discussion about the development of mindfulness and use to manage depression. Pt. Was encouraged to do daily check-in of ability and motivation and challenge self to engage in the day based on daily assessment.       Nancie Neas, LPC

## 2015-09-07 NOTE — Progress Notes (Signed)
Daily Group Progress Note  Program: IOP  Group Time: 10:30-12:00  Participation Level: Active  Behavioral Response: Appropriate  Type of Therapy:  Group Therapy  Summary of Progress: Pt presented as depressed mood in group process. Pt participated in a discussion on her anxiety and depression and her feelings of hopelessness of ever feeling better. Pt was encouraged to use her coping skills when she first wakes up to set a mood of positivity.

## 2015-09-07 NOTE — Progress Notes (Signed)
Daily Group Progress Note  Program: IOP  Group Time: 10:45-12:00pm  Participation Level: Active  Behavioral Response: Appropriate  Type of Therapy:  Group Therapy  Summary of Progress: Pt presented as talkative, engaged in the group process. Pt participated in a discussion about self-esteem. Pt was able to identify, through an activity, her overall subjective emotional evaluation of her own worth. Pt was encouraged to continue to work on her self esteem by using positive affirmations.

## 2015-09-07 NOTE — Progress Notes (Signed)
    Daily Group Progress Note  Program: IOP   Group Time: 9:00-10:45 a.m.  Participation Level: Active  Behavioral Response: Responsive  Type: Group  Summary:  Client also mentioned that she worked very hard to make her brother comfortable with her depression. She also realized Client mentioned that her mother was her lifeline, and that she was very thankful for their relationship that making her brother comfortable was not her responsibility. Therapist affirmed the client's statement, and mentioned that it was his job to figure out how to handle Evoleth's depression.    Allison Cruz, Ph.D., [facility]

## 2015-09-08 ENCOUNTER — Other Ambulatory Visit (HOSPITAL_COMMUNITY): Payer: Medicare Other

## 2015-09-09 ENCOUNTER — Other Ambulatory Visit (HOSPITAL_COMMUNITY): Payer: Medicare Other

## 2015-09-12 ENCOUNTER — Other Ambulatory Visit (HOSPITAL_COMMUNITY): Payer: Medicare Other | Admitting: Psychiatry

## 2015-09-13 ENCOUNTER — Other Ambulatory Visit (HOSPITAL_COMMUNITY): Payer: Medicare Other | Admitting: Licensed Clinical Social Worker

## 2015-09-13 DIAGNOSIS — F331 Major depressive disorder, recurrent, moderate: Secondary | ICD-10-CM

## 2015-09-13 DIAGNOSIS — F332 Major depressive disorder, recurrent severe without psychotic features: Secondary | ICD-10-CM | POA: Diagnosis not present

## 2015-09-13 NOTE — Progress Notes (Signed)
Daily Group Progress Note  Program: IOP  Group Time: 10:45-12:00pm  Participation Level: Active  Behavioral Response: Appropriate  Type of Therapy:  Psychotherapy/ Group Therapy  Summary of Progress: Pt presented as talkative, engaged in the group process. Pt participated in a discussion about "The power of No." Pt was able to identify all daily activities required and how exhausting that can be. It was suggested to pt that feeling overwhelmed continuously can lead to burn out. The opportunity to replenish mind, body and spirit can lead to a less stressful life.

## 2015-09-13 NOTE — Progress Notes (Signed)
    Daily Group Progress Note  Program: IOP  Group Time: 9:00-10:45  Participation Level: Active  Behavioral Response: Appropriate  Type of Therapy:  Group Therapy  Summary of Progress: Pt. Presented with brightened affect compared to last session. Pt. Reported disappointment with herself because she isolated most of the weekend and did not meet goals of walking in the park or going to the grocery store. Pt. Discussed reading for reflection and gave example from her family interactions of how the reading applied to her life. Pt. Was receptive to therapist recommendations to exercise attitude of acceptance and self-compassion in her life instead of self-hatred and guilt for not meeting her goals.      Nancie Neas, LPC

## 2015-09-14 ENCOUNTER — Other Ambulatory Visit (HOSPITAL_COMMUNITY): Payer: Medicare Other | Admitting: Licensed Clinical Social Worker

## 2015-09-14 DIAGNOSIS — F332 Major depressive disorder, recurrent severe without psychotic features: Secondary | ICD-10-CM | POA: Diagnosis not present

## 2015-09-14 DIAGNOSIS — F331 Major depressive disorder, recurrent, moderate: Secondary | ICD-10-CM

## 2015-09-14 NOTE — Progress Notes (Signed)
Daily Group Progress Note  Program: IOP  Group Time: 10:45-12:00pm  Participation Level: Active  Behavioral Response: Appropriate  Type of Therapy:  Psychotherapy  Summary of Progress:  Pt participated in a discussion on positive affirmations. Pt participated in an activity "I am okay" by reading one positive affirmation with explanation of thoughts/feelings surrounding the affirmation read. Pt was challenged to read the affirmation every day to overcome self-sabotaging negative thoughts.

## 2015-09-14 NOTE — Progress Notes (Signed)
    Daily Group Progress Note  Program: IOP  Group Time: 9:15-10:45 Participation Level: active Behavioral Response: engaged, responsive Type: Group Therapy Summary: Patient continues to display a flat affect. Patient has worries and fears moving forward after IOP. She also is realizing that she needs to put self-compassion into place in her life. It appears to be a disconnect between what she states and what is actually true regarding her self-care. Therapist encouraged patient to have compassion for herself.     Nancie Neas, LPC

## 2015-09-15 ENCOUNTER — Other Ambulatory Visit (HOSPITAL_COMMUNITY): Payer: Medicare Other | Admitting: Psychiatry

## 2015-09-15 DIAGNOSIS — F332 Major depressive disorder, recurrent severe without psychotic features: Secondary | ICD-10-CM | POA: Diagnosis not present

## 2015-09-15 DIAGNOSIS — F331 Major depressive disorder, recurrent, moderate: Secondary | ICD-10-CM

## 2015-09-15 NOTE — Progress Notes (Signed)
    Daily Group Progress Note  Program: IOP Group Time: 9:00-12:00   Participation Level:  active   Behavioral Response:  engaged   Type of Therapy:   group therapy   Summary of Progress: Client was alert and well dressed.  Client addressed her shame around having depression and the secrecy surrounding it.  Therapist encouraged client to surrender the shame and what she thinks she "should" be.  The client is starting to become more open about her depression with others, and was encouraged to continue to do so.  Client is actively setting up support for when discharge comes, including joining the local [employer] Association.  Nancie Neas, LPC

## 2015-09-16 ENCOUNTER — Other Ambulatory Visit (HOSPITAL_COMMUNITY): Payer: Medicare Other

## 2015-09-19 ENCOUNTER — Other Ambulatory Visit (HOSPITAL_COMMUNITY): Payer: Medicare Other | Admitting: Psychiatry

## 2015-09-19 DIAGNOSIS — F332 Major depressive disorder, recurrent severe without psychotic features: Secondary | ICD-10-CM | POA: Diagnosis not present

## 2015-09-19 DIAGNOSIS — F331 Major depressive disorder, recurrent, moderate: Secondary | ICD-10-CM

## 2015-09-20 ENCOUNTER — Other Ambulatory Visit (HOSPITAL_COMMUNITY): Payer: Medicare Other | Admitting: Licensed Clinical Social Worker

## 2015-09-20 DIAGNOSIS — F332 Major depressive disorder, recurrent severe without psychotic features: Secondary | ICD-10-CM | POA: Diagnosis not present

## 2015-09-20 DIAGNOSIS — F331 Major depressive disorder, recurrent, moderate: Secondary | ICD-10-CM

## 2015-09-20 NOTE — Progress Notes (Signed)
Daily Group Progress Note  Program: IOP  Group Time: 10:45-12:00pm  Participation Level: Active  Behavioral Response: Appropriate  Type of Therapy:  Psychotherapy  Summary of Progress:  Pt participated in a discussion on healthy boundaries. As part of self-care, pt was encouraged to set personal boundaries with limits and rules, which encourages mental wellness. Self-care is a common theme that promotes mental wellness.   Alver Fisher, LCASA

## 2015-09-20 NOTE — Progress Notes (Signed)
    Daily Group Progress Note  Program: IOP  Group Time: 9:00-10:30  Participation Level: Active  Behavioral Response: Appropriate  Type of Therapy:  Group Therapy  Summary of Progress: Pt. Presented as talkative, engaged in the group process. Pt. Reported that she was a 3/10 for energy today. Pt. Shared that she has fear that her Ketamine treatments have not been working and she is considering ECT again, but is concerned about the memory loss. Pt. Discussed feeling disappointment and loss of hope due to treatments not working. Pt. Discussed treatment plan which includes possible going to group for two days a weeks, reconnecting with her therapist, researching ECT programs that are outpatient compared to inpatient programs, and beginning mental health association.      Nancie Neas, LPC

## 2015-09-20 NOTE — Progress Notes (Signed)
    Daily Group Progress Note  Program: IOP  Group Time: 9:00-10:30  Participation Level: Active  Behavioral Response: Appropriate  Type of Therapy:  Psychoeducation  Summary of Progress: Pt. Attended medication management group with [facility].     Nancie Neas, LPC

## 2015-09-21 ENCOUNTER — Other Ambulatory Visit (HOSPITAL_COMMUNITY): Payer: Medicare Other | Admitting: Psychiatry

## 2015-09-21 DIAGNOSIS — F331 Major depressive disorder, recurrent, moderate: Secondary | ICD-10-CM

## 2015-09-21 DIAGNOSIS — F332 Major depressive disorder, recurrent severe without psychotic features: Secondary | ICD-10-CM | POA: Diagnosis not present

## 2015-09-21 NOTE — Progress Notes (Signed)
Daily Group Progress Note Program: IOP Group Time: 10:45-12:00pm  Participation Level: Not active  Behavioral Response: Appropriate  Type of Therapy:  Psychoeducation  Summary of Progress: Pt did not participate. in chair yoga, facilitated by certified yoga instructor Jan Fireman, which focused on breath with movement. As a part of self-care and mindfulness for patients with anxiety and depression, yoga helps train the relaxation response. Pt was supposed to participate in her graduation from the program but left during yoga.   Alver Fisher, LCASA

## 2015-09-21 NOTE — Progress Notes (Signed)
Allison Cruz is a 61 y.o. , single, Caucausian female, who was referred by Dr. Lorri Frederick; treatment for worsening depressive and anxiety symptoms. Pt denied SI/HI or A/V hallucinations. Stated she started spiraling down in January 2016. Reported it got so bad that she had to live with her mother off and on. Pt has been receiving Ketamine treatments weekly.  States her last weekly one will be on this Friday (09/23/15).  Reported that the treatments vary each time. "My doctor wanted me to attend this program in order to have some structure while receiving the fusions." Pt admits to her first psychiatric hospitalization at age 36 due to depression. Has been admitted twice at [facility] due to rounds of ECT. Pt has received [facility] at [facility] in the past. Pt is currently seeing Dr. Barbie Banner and a therapist in his office. Denies any prior suicide attempts or gestures. Stressors: 1) Unresolved grief/loss issues: 52 yr old dog died last yr. 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 "I miss working so much." 2) Sales promotion account executive. Due to the multiple treatments, pt has gone into her 401-K. 3) Medical Illnesses: Fibromyalgia, Chronic fatigue, Tachycardia. Family Hx: Father (Depression: He had ECT)   Patients Currently Reported Symptoms/Problems: tearfulness, increased anxiety, sadness, poor self esteem, anhedonia, no motivation, poor energy, poor sleep, increased appetite (has gained 30 lbs), feelings of hopelessness and helplessness. Pt completed MH-IOP today.  Reports feeling scared about her future.  "I feel like I'm no better.  I'm tired of making decisions."  Pt had an orientation appointment yesterday with [facility], but didn't attend.  She is considering ECT again, but is hesitant due to loss of memory.  Pt denies SI/HI or A/V hallucinations.  A:   D/C today.  F/U with Dr. Oneta Rack on 09-23-15, Dr. Barbie Banner in 3-4 weeks, Hardie Pulley, LCSW on 09-27-15.  Encouraged support groups.  Provided pt with [facility] and [facility] information.  Strongly encouraged pt to attend Wednesday support group at [facility].  R:  Pt receptive.      Carlis Abbott, RITA, M.Ed, CNA

## 2015-09-21 NOTE — Progress Notes (Signed)
Patient ID: ANISSIA OGEA, female   DOB: 01-16-1956, 60 y.o.   MRN: [id number] Discharge Note  Patient:  Allison Cruz is an 60 y.o., female DOB:  08/07/1955  Date of Admission:  08/18/2015 Date of Discharge:  09/21/2015  Reason for Admission:depression  IOP Course:  Allison Cruz said the IOP helped a lot in the sense that it gave her something to get up for in the morning.  The group leader was especially helpful in that she was supportive and offered help with coping skills.  Being around others where she did not have to put on a front was also helpful.  Overall, however she leaves with the same level of depression and hopelessness that she came to [place] with.  She has tried all medications, TMS, ketamine, ECT and multiple rounds of therapy as well as multiple inpatient stays with no lasting help.  We discussed inpatient again but she says that will not offer her anything she does not have already to help her cope.  What she wants to do is to pursue ECT again as much as she dreads the memory problems because in the past it has been helpful.  She will do that on her own as she wants to try the program at [facility] based on recommendations from a former psychiatrist that she trusted.  She admits to suicidal thoughts but denies any intent or plan at the moment.  Mental Status at [place] thoughts daily without intent or plan.  A large degree of hopelessness but with enough hope to try another round of ECT she said  Lab Results: No results found for this or any previous visit (from the past 48 hour(s)).   Current outpatient prescriptions:  .  aspirin EC 81 MG tablet, Take 81 mg by mouth daily., Disp: , Rfl:  .  B Complex Vitamins (VITAMIN B COMPLEX PO), Take by mouth daily., Disp: , Rfl:  .  Bisoprolol Fumarate (ZEBETA PO), Take by mouth., Disp: , Rfl:  .  buPROPion (WELLBUTRIN SR) 100 MG 12 hr tablet, Take 200 mg by mouth every morning., Disp: , Rfl: 1 .  Calcium Carb-Cholecalciferol  (CALCIUM 1000 + D PO), Take by mouth., Disp: , Rfl:  .  clonazePAM (KLONOPIN) 1 MG tablet, Take 1-2 mg by mouth 2 (two) times daily as needed for anxiety., Disp: , Rfl:  .  Coenzyme Q10 (CO Q 10 PO), Take by mouth., Disp: , Rfl:  .  dextroamphetamine (DEXTROSTAT) 10 MG tablet, Take 1 tablet (10 mg total) by mouth 2 (two) times daily., Disp: 60 tablet, Rfl: 0 .  fish oil-omega-3 fatty acids 1000 MG capsule, Take 2 g by mouth daily., Disp: , Rfl:  .  FLUoxetine (PROZAC) 40 MG capsule, Take 40 mg by mouth daily., Disp: , Rfl:  .  L-Methylfolate-Algae (DEPLIN 15) 15-90.314 MG CAPS, Take 1 capsule by mouth daily., Disp: , Rfl: 0 .  Probiotic Product (PROBIOTIC DAILY) CAPS, Take by mouth daily., Disp: , Rfl:  .  promethazine (PHENERGAN) 25 MG tablet, as needed. , Disp: , Rfl:  .  rizatriptan (MAXALT) 10 MG tablet, as needed., Disp: , Rfl:  .  Vitamin D, Ergocalciferol, (DRISDOL) 50000 UNITS CAPS capsule, Every other week, Disp: , Rfl:  .  zolpidem (AMBIEN) 10 MG tablet, 10 mg daily., Disp: , Rfl:   Axis Diagnosis:  Severe major depression, recurrent without psychosis   Level of Care:  IOP  Discharge destination:  Other:  has appointments with psychiatrist, ketamine psychiatrist  and therapist and plans to pursue ECT again  Is patient on multiple antipsychotic therapies at discharge:  No    Has Patient had three or more failed trials of antipsychotic monotherapy by history:  Negative  Patient phone:  [phone] (home)  Patient address:   [place] [address],   Follow-up recommendations:  Activity:  continue current activity Diet:  continue current diet  Comments:  Inpatient was considered and offered.  She wants to try ECT saying that inpatient was never that helpful and that in spite of severe depression she is safe thinking of her mother and some hope ECT might help again  The patient received suicide prevention pamphlet:  Yes   Allison Cruz 09/21/2015, 11:54  AM

## 2015-09-21 NOTE — Progress Notes (Signed)
    Daily Group Progress Note  Program: IOP   Group Time: 9:15-10:45 a.m.  Participation Level: active  Behavioral Response: engaged, responsive  Type: Group Therapy  Summary: Patient was very depressed today about discharging. She also mentioned that she was very nervous about memory loss with doing ECT. Therapist stated that ECT (in the past) had not affected her insight and compassion to other group members. Therapist also shared the progress that she has made, and that the group will hold hope for her in the future.  Nancie Neas, LPC

## 2015-09-21 NOTE — Patient Instructions (Signed)
Patient completed MH-IOP today.  Will follow up with Dr. Barbie Banner in 3-4 weeks, Hardie Pulley, LCSW on 09-27-15, Dr. Oneta Rack on 09-23-15.  Pt will possibly go for ECT at [facility].  Encouraged support groups.

## 2015-09-22 ENCOUNTER — Other Ambulatory Visit (HOSPITAL_COMMUNITY): Payer: Medicare Other

## 2015-09-23 ENCOUNTER — Other Ambulatory Visit (HOSPITAL_COMMUNITY): Payer: Medicare Other

## 2015-09-26 ENCOUNTER — Other Ambulatory Visit (HOSPITAL_COMMUNITY): Payer: Medicare Other

## 2015-09-27 ENCOUNTER — Encounter: Payer: Self-pay | Admitting: Obstetrics & Gynecology

## 2015-09-27 ENCOUNTER — Other Ambulatory Visit (HOSPITAL_COMMUNITY): Payer: Medicare Other

## 2015-09-28 ENCOUNTER — Other Ambulatory Visit (HOSPITAL_COMMUNITY): Payer: Medicare Other

## 2015-09-29 ENCOUNTER — Other Ambulatory Visit (HOSPITAL_COMMUNITY): Payer: Medicare Other

## 2015-09-30 ENCOUNTER — Other Ambulatory Visit (HOSPITAL_COMMUNITY): Payer: Medicare Other

## 2015-10-03 ENCOUNTER — Other Ambulatory Visit (HOSPITAL_COMMUNITY): Payer: Medicare Other

## 2015-10-05 ENCOUNTER — Other Ambulatory Visit (HOSPITAL_COMMUNITY): Payer: Medicare Other

## 2015-10-06 ENCOUNTER — Other Ambulatory Visit (HOSPITAL_COMMUNITY): Payer: Medicare Other

## 2015-10-07 ENCOUNTER — Other Ambulatory Visit (HOSPITAL_COMMUNITY): Payer: Medicare Other

## 2015-10-10 ENCOUNTER — Other Ambulatory Visit (HOSPITAL_COMMUNITY): Payer: Medicare Other

## 2015-10-11 ENCOUNTER — Other Ambulatory Visit (HOSPITAL_COMMUNITY): Payer: Medicare Other

## 2015-10-12 ENCOUNTER — Other Ambulatory Visit (HOSPITAL_COMMUNITY): Payer: Medicare Other

## 2015-10-12 DIAGNOSIS — M797 Fibromyalgia: Secondary | ICD-10-CM | POA: Insufficient documentation

## 2015-10-27 ENCOUNTER — Encounter: Payer: Self-pay | Admitting: Obstetrics & Gynecology

## 2015-10-31 ENCOUNTER — Ambulatory Visit: Payer: BC Managed Care – PPO | Admitting: Obstetrics & Gynecology

## 2015-12-21 ENCOUNTER — Encounter: Payer: Self-pay | Admitting: Obstetrics & Gynecology

## 2015-12-29 ENCOUNTER — Telehealth: Payer: Self-pay

## 2015-12-29 NOTE — Telephone Encounter (Signed)
Telephone encounter created to discuss mychart message with [name] Please see encounter dated 12/29/2015.

## 2015-12-29 NOTE — Telephone Encounter (Signed)
Routing to [place] for review and advise.  Non-Urgent Medical Question  Message [id number]  From ABBIGAL CURLEE To [name], MD Sent 12/21/2015 1:13 PM  Dr. Jerilynn Mages  I am sorry to bother you but I am just now getting back to [facility] and having maintenance ECT at [facility]. Doing some better thankfully. But hardest year of my life. Honestly times I wasn't sure I was going to make it. That was very scary and dark. Thankful for my faith. I called Dr. Quay Burow who you recommended for me. She is taking new patients but her first available appt I could get was in January. Obviously can't wait that long. So she must be really good for that wait and for you to recommend. Is there anyone else that you would recommend? I tried every way to get in to see Dr. Quay Burow but they would not budge with me. I miss Dr. Minna Antis so much! Grateful for any suggestion you could make. I hope and pray your family is doing well and enjoying the last hot hazy days of summer. Thank you so much. [name]   Responsible Party   Pool - [facility] Pool No one has taken responsibility for this message.  No actions have been taken on this message.

## 2016-01-02 ENCOUNTER — Ambulatory Visit (INDEPENDENT_AMBULATORY_CARE_PROVIDER_SITE_OTHER): Payer: Medicare Other | Admitting: Obstetrics & Gynecology

## 2016-01-02 ENCOUNTER — Encounter: Payer: Self-pay | Admitting: Obstetrics & Gynecology

## 2016-01-02 VITALS — BP 110/70 | HR 80 | Resp 14 | Ht 65.0 in | Wt 186.6 lb

## 2016-01-02 DIAGNOSIS — Z124 Encounter for screening for malignant neoplasm of cervix: Secondary | ICD-10-CM | POA: Diagnosis not present

## 2016-01-02 DIAGNOSIS — Z205 Contact with and (suspected) exposure to viral hepatitis: Secondary | ICD-10-CM | POA: Diagnosis not present

## 2016-01-02 DIAGNOSIS — Z0000 Encounter for general adult medical examination without abnormal findings: Secondary | ICD-10-CM

## 2016-01-02 DIAGNOSIS — Z01419 Encounter for gynecological examination (general) (routine) without abnormal findings: Secondary | ICD-10-CM

## 2016-01-02 LAB — POCT URINALYSIS DIPSTICK
Bilirubin, UA: NEGATIVE
Blood, UA: NEGATIVE
Glucose, UA: NEGATIVE
Ketones, UA: NEGATIVE
Leukocytes, UA: NEGATIVE
Nitrite, UA: NEGATIVE
Protein, UA: NEGATIVE
Urobilinogen, UA: NEGATIVE
pH, UA: 8

## 2016-01-02 NOTE — Progress Notes (Signed)
60 y.o. G0P0 SingleCaucasianF here for annual exam.  Continues to struggles with depression.  Medications this past year showed tardive dyskinesia.  Took about three days for this to resolve.  Seeing psychiatrist in [facility], Dr. Barbie Banner.  He did do DNA testing on her.  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  Did [place] in [place] and lived with her mother during this time.  Did Ketamine injections and this didn't help for very long.  Did ECT at [facility], 3 a week x 12 weeks.  About week 10, she started having some short term memory issues.  So, stopped at 12 weeks.  Now doing "maintnance" at [facility].  Hasn't done one over the last three weeks.  She is also seeing Corinna Capra, PhD, here in [place].  Has third appt with him on Thursday.  Denies vaginal bleeding.    Frustrated with her weight.  Medication last year caused weight loss and she has gained this back.  Saw. Dr. [name] in September.  Was a terrible visit for her.  Will not go back to him again.  Names given.    Pt reports she had a late night episode of calling her mother and telling her things that just didn't make sense.    Patient's last menstrual period was 12/31/2008.          Sexually active: No.  The current method of family planning is post menopausal status.    Exercising: No.  The patient does not participate in regular exercise at present. Smoker:  no  Health Maintenance: Pap:  10/26/14 negative, 2013 HR HPV negative  History of abnormal Pap:  no MMG:  09/14/15 BIRADS 1 negative  Colonoscopy:  12/08/14 normal repeat 5 years BMD:   08/11/14 osteoporosis  TDaP:  02/15/11  Pneumonia vaccine(s):  never Zostavax:   never Hep C testing: drawn today  Screening Labs: brought copy from Dr. [name], Hb today: same, Urine today: normal     reports that she has never smoked. She has never used smokeless tobacco. She reports that she does not drink alcohol or use drugs.  Past Medical History:  Diagnosis Date  . Anxiety   . Basal cell  carcinoma of face    forehead  . CFS (chronic fatigue syndrome)   . Depression   . Hematuria    neg eval Dr. Joelyn Oms, 2000  . Renal cyst   . Sciatica    bulging L5-S1  . Tachycardia    negative stress test    Past Surgical History:  Procedure Laterality Date  . MOHS SURGERY  9/08   Dr Eula Fried    Current Outpatient Prescriptions  Medication Sig Dispense Refill  . aspirin EC 81 MG tablet Take 81 mg by mouth daily.    . B Complex Vitamins (VITAMIN B COMPLEX PO) Take by mouth daily.    . Bisoprolol Fumarate (ZEBETA PO) Take by mouth.    Marland Kitchen buPROPion (WELLBUTRIN SR) 100 MG 12 hr tablet Take 200 mg by mouth every morning.  1  . Calcium Carb-Cholecalciferol (CALCIUM 1000 + D PO) Take by mouth.    . clonazePAM (KLONOPIN) 1 MG tablet Take 1-2 mg by mouth 2 (two) times daily as needed for anxiety.    . Coenzyme Q10 (CO Q 10 PO) Take by mouth.    . dextroamphetamine (DEXTROSTAT) 10 MG tablet Take 1 tablet (10 mg total) by mouth 2 (two) times daily. 60 tablet 0  . fish oil-omega-3 fatty acids 1000 MG capsule  Take 2 g by mouth daily.    Marland Kitchen FLUoxetine (PROZAC) 40 MG capsule Take 40 mg by mouth daily.    Marland Kitchen L-Methylfolate-Algae (DEPLIN 15) 15-90.314 MG CAPS Take 1 capsule by mouth daily.  0  . Probiotic Product (PROBIOTIC DAILY) CAPS Take by mouth daily.    . promethazine (PHENERGAN) 25 MG tablet as needed.     . rizatriptan (MAXALT) 10 MG tablet as needed.    . Vitamin D, Ergocalciferol, (DRISDOL) 50000 UNITS CAPS capsule Every other week    . zolpidem (AMBIEN) 10 MG tablet 10 mg daily.     No current facility-administered medications for this visit.     Family History  Problem Relation Age of Onset  . Breast cancer Mother 81    her 2 neu+  . Hypertension Mother   . Heart Problems Mother     pacemaker placed  . Heart disease Maternal Grandmother   . Heart disease Paternal Grandmother   . Colon cancer Father 88       . Depression Father   . Breast cancer Maternal Aunt     maternal  great aunt    ROS:  Pertinent items are noted in HPI.  Otherwise, a comprehensive ROS was negative.  Exam:   Vitals:   01/02/16 1314  BP: 110/70  Pulse: 80  Resp: 14   General appearance: alert, cooperative and appears stated age Head: Normocephalic, without obvious abnormality, atraumatic Neck: no adenopathy, supple, symmetrical, trachea midline and thyroid normal to inspection and palpation Lungs: clear to auscultation bilaterally Breasts: normal appearance, no masses or tenderness Heart: regular rate and rhythm Abdomen: soft, non-tender; bowel sounds normal; no masses,  no organomegaly Extremities: extremities normal, atraumatic, no cyanosis or edema Skin: Skin color, texture, turgor normal. No rashes or lesions Lymph nodes: Cervical, supraclavicular, and axillary nodes normal. No abnormal inguinal nodes palpated Neurologic: Grossly normal   Pelvic: External genitalia:  no lesions              Urethra:  normal appearing urethra with no masses, tenderness or lesions              Bartholins and Skenes: normal                 Vagina: normal appearing vagina with normal color and discharge, no lesions              Cervix: no lesions              Pap taken: Yes.   Bimanual Exam:  Uterus:  normal size, contour, position, consistency, mobility, non-tender              Adnexa: normal adnexa and no mass, fullness, tenderness               Rectovaginal: Confirms               Anus:  normal sphincter tone, no lesions  Chaperone was present for exam.  A:    Well Woman with normal exam Long history of depression, H/O ECT, has been on multiple medications over the years for this.  Seeing new psychiatrist and PhD as well Family hx of breast cancer in mother Dense breasts Rectal bleeding/firmness at 7 o'clock  P: Mammogram yearly.  Having follow up breast exams every six months. Names for new PCP given Hep C today Recheck breasts 6 months.  AEX 1 year.

## 2016-01-02 NOTE — Telephone Encounter (Signed)
Spoke with pt personally about this today at her OV.  Ok to close encounter.

## 2016-01-03 ENCOUNTER — Encounter: Payer: Self-pay | Admitting: Obstetrics & Gynecology

## 2016-01-03 ENCOUNTER — Telehealth: Payer: Self-pay

## 2016-01-03 DIAGNOSIS — F489 Nonpsychotic mental disorder, unspecified: Secondary | ICD-10-CM

## 2016-01-03 DIAGNOSIS — Z8669 Personal history of other diseases of the nervous system and sense organs: Secondary | ICD-10-CM

## 2016-01-03 LAB — HEPATITIS C ANTIBODY: HCV Ab: NEGATIVE

## 2016-01-03 NOTE — Telephone Encounter (Signed)
Telephone encounter created to review MyChart message with [name]. Please see encounter dated 01/03/2016.

## 2016-01-03 NOTE — Telephone Encounter (Signed)
Does Allison Cruz want to see headache specialist.  Dr. Joretta Bachelor located at [address], [place], [place] [address].  Phone [phone].  Ok to refer to Dr. Jannifer Franklin as well.

## 2016-01-03 NOTE — Telephone Encounter (Signed)
AllisonMiller, okay to refer patient to [facility]?  Non-Urgent Medical Question  Message [id number]  From Allison Cruz To Allison Salon, MD Sent 01/03/2016 3:56 AM  Dr. Vanetta Cruz for you taking time with me yesterday. It has been such a tough year. I didn't know how to attempt to explain it in a few words. Yet I thought about it and I am sure I put you behind and I wanted to apologize for that. Yet I need to ask you another question. I didn't ask yesterday because I thought we had it figured out. Do you know any good neurologist in town? I have now had two episodes... one of which happened tonight. The first one was Saturday evening. I had a migraine pretty much all day and had taken a lot of meds for it. I called my mom in the evening and she reluctantly told me I made no sense in conversation. That I skipped from one subject to the next and it was just so hard to even attempt to follow me and I they were just not true. I really thought about it and realized I had taken too much meds for headache and then took night meds... which one of them was Allison Cruz. If I told you this I am sorry. I just don't remember if I did. Didn't worry too much about it.  Thought it was med connected. But woke up and was getting ready to get dressed to come to my dr appts and talked to my mom who informed me it was Sunday. Still thought it was related to meds and would talk to psy when I saw him. Tonight I got very scared. I was texting my friend who was taking me to ECT today. I remember texting her and felt kinda of groggy/sleepy which does not happen to me often. I got up and went to bathroom and came out and petted Allison Cruz... my new rescue dog I forgot to tell you about. Anyway I was fine, ... alert. Remembered that I was texting Allison Cruz and went back to finish it. When I read over it to make sure I had given her all information. Three fourths of the letter made perfect sense but the last couple sentences  made absolutely no sense. Skipping subjects etc. that should not have happened... I had not taken night meds yet and I had taken couple tynenol about nine in morning for headache and taken one maxalt for migraine in afternoon. Taken that much or  really little for migraines before. Allison Cruz did send me for a CAT Scan about three to four months ago and was normal. But I am scared to my core about these episodes of just not thinking clearly. I don't think it is ECT related as this did not happen on my first two times. You know how I think. I am petrified I have something really wrong with me. All I can think of is early dementia etc. I have prayed and cried tonight. I so have tried so hard to get well to have a chance at a fully lived life again in community, helping others, and having a purpose. Now I am so scared that won't happen. I have never had anything like these two episodes. I think I need to see a neurologist. I really don't want to have to go to Allison Cruz to ask him for a referral to one. But I will if that's what I should do. I am going  to worry myself sick until I get some answers. You know the severity of my anxiety. I am not sure I have ever been this scared. Our brains are complicated precious things. My question  (sorry I have taken so long to explain) is do you know any good neurologist that you could refer me to? I understand if that is not something you really get involved with. I just thought I would ask before I made appt with Allison Cruz. Or I could ask Allison Cruz Wednesday even though he is not a dr... Phd psychologist... I can't wait weeks for appt and not sure he would understand how terrified I really am that something is wrong. I so pray that it is not with all my heart. But I am so scared. Thank you for your time. So grateful for you whether this is something that you can do or not. Blessings and love to you and your family. Allison Cruz.   Responsible Party   Pool - [facility]  Pool No one has taken responsibility for this message.  No actions have been taken on this message.

## 2016-01-03 NOTE — Telephone Encounter (Signed)
Call to Dr.Hagen's office at [phone]. Dr.Hagen's next new patient appointment is 02/02/2016. Call to patient. Advised of message as seen below from [place]. Patient states that she is going to see her psychologist tomorrow and would like to speak with him about his recommendations. She will return call to the office for referral. Referral to [facility] to see Dr.Willis pending.  Routing to [place] as Juluis Rainier

## 2016-01-04 NOTE — Telephone Encounter (Signed)
Spoke with patient. Patient states that she would like to be referred to Dr.Willis at this time. Advised referral will be placed and she will be notified of appointment date and time. She is agreeable. Referral placed in EPIC to Dr.Willis. Call to Dr.Willis's office referral also faxed to referrals coordinator Diane at [phone] for patient to be scheduled as soon as possible.

## 2016-01-05 LAB — IPS PAP TEST WITH HPV

## 2016-01-05 NOTE — Telephone Encounter (Signed)
Contacted Dr.Willis's office to follow up on the status of referral. Left message for referrals coordinator Diane to return call to the office to facilitate scheduling patient's appointment.

## 2016-01-10 NOTE — Telephone Encounter (Signed)
Patient is scheduled to see Dr.Willis on 01/20/2016. The patient has been notified of appointment date and time by Dr.Willis's office. Please see referral note.  Routing to provider for final review. Patient agreeable to disposition. Will close encounter.

## 2016-01-11 ENCOUNTER — Encounter: Payer: Self-pay | Admitting: Obstetrics & Gynecology

## 2016-01-20 ENCOUNTER — Encounter: Payer: Self-pay | Admitting: Neurology

## 2016-01-20 ENCOUNTER — Ambulatory Visit (INDEPENDENT_AMBULATORY_CARE_PROVIDER_SITE_OTHER): Payer: Medicare Other | Admitting: Neurology

## 2016-01-20 VITALS — BP 116/81 | HR 86 | Ht 65.0 in | Wt 182.0 lb

## 2016-01-20 DIAGNOSIS — F5104 Psychophysiologic insomnia: Secondary | ICD-10-CM | POA: Diagnosis not present

## 2016-01-20 DIAGNOSIS — G43019 Migraine without aura, intractable, without status migrainosus: Secondary | ICD-10-CM

## 2016-01-20 DIAGNOSIS — F05 Delirium due to known physiological condition: Secondary | ICD-10-CM | POA: Diagnosis not present

## 2016-01-20 HISTORY — DX: Psychophysiologic insomnia: F51.04

## 2016-01-20 HISTORY — DX: Migraine without aura, intractable, without status migrainosus: G43.019

## 2016-01-20 MED ORDER — ALPRAZOLAM 0.5 MG PO TABS: ORAL_TABLET | ORAL | 0 refills | Status: DC

## 2016-01-20 NOTE — Progress Notes (Signed)
Reason for visit: Headache and confusion  Referring physician: Dr. [name] is a 60 y.o. female  History of present illness:  Allison Cruz is a 60 year old right-handed white female with a history of chronic insomnia and severe depression and anxiety. The patient has been on a multitude of antidepressant medications in the past and she has undergone transcranial magnetic stimulation in the spring of 2017 with 25 treatments, and she has subsequently had ECT treatments for depression that are continuing. The patient has a lifelong history of migraine headaches, she is having on average 10 headache days a month, she takes Maxalt for this. The headaches may be associated with photophobia, phonophobia, and some nausea. The patient indicates that garlic and certain odors such as perfumes are activators, in the past her menstrual cycle was an activator for the headache. The patient has begun having episodes of confusion that began around 12/24/2015. The patient has had a total of 6 such events, 3 of them were associated with migraine headache. At least one of the events was associated with overdosing of medications, she took Ambien, Phenergan, oxycodone, and Maxalt around the same time. She was talking with her mother on the telephone after taking the medications and she was speaking in a nonsensical fashion. The patient has chronic insomnia, she sleeps only about 4 hours a night, she takes clonazepam at least 2 mg daily and Ambien 10 mg at night. The most recent episode of transient confusion was on October 17. Most of her events have been associated with texting on her telephone, she will feel somewhat spacey and then begin typing sentences that are nonsensical. At times she can catch herself and correct the sentences. The patient reports no other associated symptoms such as numbness, weakness, visual disturbance with the episodes. She denies any balance problems or difficulty controlling  the bowels or the bladder. She is sent to this office for an evaluation.  Past Medical History:  Diagnosis Date  . Anxiety   . Basal cell carcinoma of face    forehead  . CFS (chronic fatigue syndrome)   . Depression   . Fibromyalgia   . Hematuria    neg eval Dr. Joelyn Oms, 2000  . Migraine   . Renal cyst   . Sciatica    bulging L5-S1  . Tachycardia    negative stress test    Past Surgical History:  Procedure Laterality Date  . MOHS SURGERY  9/08   Dr Eula Fried    Family History  Problem Relation Age of Onset  . Colon cancer Father 1       . Depression Father   . Breast cancer Mother 71    her 2 neu+  . Hypertension Mother   . Heart Problems Mother     pacemaker placed  . Heart disease Maternal Grandmother   . Heart disease Paternal Grandmother   . Breast cancer Maternal Aunt     maternal great aunt    Social history:  reports that she has never smoked. She has never used smokeless tobacco. She reports that she does not drink alcohol or use drugs.  Medications:  Prior to Admission medications   Medication Sig Start Date End Date Taking? Authorizing Provider  aspirin EC 81 MG tablet Take 81 mg by mouth daily.   Yes Historical Provider, MD  B Complex Vitamins (VITAMIN B COMPLEX PO) Take by mouth daily.   Yes Historical Provider, MD  Bisoprolol Fumarate (ZEBETA PO) Take 2.5 mg  by mouth daily.    Yes Historical Provider, MD  buPROPion (WELLBUTRIN SR) 200 MG 12 hr tablet Take 200 mg by mouth every morning. 03/30/15  Yes Historical Provider, MD  Calcium Carb-Cholecalciferol (CALCIUM 1000 + D PO) Take by mouth.   Yes Historical Provider, MD  clonazePAM (KLONOPIN) 1 MG tablet Take 1-2 mg by mouth 2 (two) times daily as needed for anxiety.   Yes Historical Provider, MD  Coenzyme Q10 (CO Q 10 PO) Take by mouth.   Yes Historical Provider, MD  fish oil-omega-3 fatty acids 1000 MG capsule Take 3 g by mouth daily.    Yes Historical Provider, MD  fluocinonide gel (LIDEX) 0.05 %  APPLY TO ULCERATION AFTER MEALS AND AT BEDTIME(TONGUE) 08/10/15  Yes Historical Provider, MD  FLUOXETINE HCL PO Take 30 mg by mouth daily.    Yes Historical Provider, MD  L-Methylfolate-Algae (DEPLIN 15) 15-90.314 MG CAPS Take 1 capsule by mouth daily. 04/14/15  Yes Historical Provider, MD  memantine (NAMENDA) 5 MG tablet Take 7.5 mg by mouth daily.   Yes Historical Provider, MD  methylphenidate (RITALIN) 20 MG tablet Take 20 mg by mouth.   Yes Historical Provider, MD  NON FORMULARY SWISH, GARGLE, AND SPIT 2 TEASPOONFUL BY MOUTH EVERY 6 HOURS AS NEEDED, MAY SWALLOW IF ESOPHAGEL. magic mouth wash 06/14/15  Yes Historical Provider, MD  pramipexole (MIRAPEX) 1.5 MG tablet Take 1.5 mg by mouth daily.  12/26/15  Yes Historical Provider, MD  Probiotic Product (PROBIOTIC DAILY) CAPS Take by mouth daily.   Yes Historical Provider, MD  promethazine (PHENERGAN) 25 MG tablet as needed.  07/04/12  Yes Historical Provider, MD  rizatriptan (MAXALT) 10 MG tablet as needed. 03/19/13  Yes Historical Provider, MD  Vitamin D, Ergocalciferol, (DRISDOL) 50000 UNITS CAPS capsule Every other week 01/19/13  Yes Historical Provider, MD  zolpidem (AMBIEN) 10 MG tablet 10 mg daily. 01/28/13  Yes Historical Provider, MD      Allergies  Allergen Reactions  . Dramamine Ii [Meclizine Hcl]   . Erythromycin   . Oxycodone     Patient states no allergy to this medication  . Penicillins     Patient states no allergy to this medication  . Sulfa Antibiotics     ROS:  Out of a complete 14 system review of symptoms, the patient complains only of the following symptoms, and all other reviewed systems are negative.  Fatigue Blurred vision Easy bruising Allergies Memory loss, confusion, headache Tremor Depression, anxiety, not enough sleep, decreased energy, disinterest in activities Insomnia, sleepiness  Blood pressure 116/81, pulse 86, height 5\' 5"  (1.651 m), weight 182 lb (82.6 kg), last menstrual period  12/31/2008.  Physical Exam  General: The patient is alert and cooperative at the time of the examination.  Eyes: Pupils are equal, round, and reactive to light. Discs are flat bilaterally.  Neck: The neck is supple, no carotid bruits are noted.  Respiratory: The respiratory examination is clear.  Cardiovascular: The cardiovascular examination reveals a regular rate and rhythm, no obvious murmurs or rubs are noted.  Skin: Extremities are without significant edema.  Neurologic Exam  Mental status: The patient is alert and oriented x 3 at the time of the examination. The patient has apparent normal recent and remote memory, with an apparently normal attention span and concentration ability.  Cranial nerves: Facial symmetry is present. There is good sensation of the face to pinprick and soft touch bilaterally. The strength of the facial muscles and the muscles to head turning  and shoulder shrug are normal bilaterally. Speech is well enunciated, no aphasia or dysarthria is noted. Extraocular movements are full. Visual fields are full. The tongue is midline, and the patient has symmetric elevation of the soft palate. No obvious hearing deficits are noted.  Motor: The motor testing reveals 5 over 5 strength of all 4 extremities. Good symmetric motor tone is noted throughout.  Sensory: Sensory testing is intact to pinprick, soft touch, vibration sensation, and position sense on all 4 extremities. No evidence of extinction is noted.  Coordination: Cerebellar testing reveals good finger-nose-finger and heel-to-shin bilaterally.  Gait and station: Gait is normal. Tandem gait is normal. Romberg is negative. No drift is seen.  Reflexes: Deep tendon reflexes are symmetric and normal bilaterally. Toes are downgoing bilaterally.   CT brain 08/12/15:  IMPRESSION: Normal study.  * CT scan images were reviewed online. I agree with the written report.    Assessment/Plan:  1. Episodic  confusional states  2. Severe depression, ECT treatments  3. Chronic insomnia  The patient has had several events of slight confusion, generating nonsensical speech or language. Events may last 20-30 minutes and then clear. At least 3 of the events have been associated with migraine headaches, migraine can produce such events. The patient has also had at least one event that was associated with overdosing on medication. The patient will be set up for MRI evaluation of the brain, and an EEG study will be done. The patient will keep track of the events in the future to determine if migraine has a closer association with the generation of these events. We may consider addition of Topamax for migraine in the future.  Jill Alexanders MD 01/20/2016 9:53 AM  [facility] [address] [place], [place] [address]  Phone [phone] Fax [phone]

## 2016-01-27 ENCOUNTER — Ambulatory Visit: Payer: BC Managed Care – PPO | Admitting: Obstetrics & Gynecology

## 2016-01-29 ENCOUNTER — Encounter: Payer: Self-pay | Admitting: Obstetrics & Gynecology

## 2016-02-02 ENCOUNTER — Other Ambulatory Visit: Payer: BC Managed Care – PPO

## 2016-02-07 ENCOUNTER — Ambulatory Visit
Admission: RE | Admit: 2016-02-07 | Discharge: 2016-02-07 | Disposition: A | Discharge status: Home / Self Care | Payer: Medicare Other | Source: Ambulatory Visit | Attending: Neurology | Admitting: Neurology

## 2016-02-07 DIAGNOSIS — G43019 Migraine without aura, intractable, without status migrainosus: Secondary | ICD-10-CM | POA: Diagnosis not present

## 2016-02-07 DIAGNOSIS — F05 Delirium due to known physiological condition: Secondary | ICD-10-CM | POA: Diagnosis not present

## 2016-02-07 MED ORDER — GADOBENATE DIMEGLUMINE 529 MG/ML IV SOLN
17.0000 mL | Freq: Once | INTRAVENOUS | Status: AC | PRN
  Administered 2016-02-07: 17 mL via INTRAVENOUS

## 2016-02-09 ENCOUNTER — Telehealth: Payer: Self-pay | Admitting: Neurology

## 2016-02-09 NOTE — Telephone Encounter (Signed)
  I called the patient. The MRI of the brain was normal. The EEG is pending.   MRI brain 02/09/16:  IMPRESSION:  Normal MRI brain (with and without). No change from MRI on 07/31/03.

## 2016-02-22 ENCOUNTER — Other Ambulatory Visit: Payer: BC Managed Care – PPO

## 2016-02-28 ENCOUNTER — Encounter: Payer: Self-pay | Admitting: Obstetrics & Gynecology

## 2016-03-07 ENCOUNTER — Encounter: Payer: Self-pay | Admitting: Obstetrics & Gynecology

## 2016-04-26 ENCOUNTER — Ambulatory Visit: Payer: BC Managed Care – PPO | Admitting: Neurology

## 2016-07-03 ENCOUNTER — Encounter: Payer: Self-pay | Admitting: Obstetrics & Gynecology

## 2016-07-03 ENCOUNTER — Ambulatory Visit (INDEPENDENT_AMBULATORY_CARE_PROVIDER_SITE_OTHER): Payer: Medicare Other | Admitting: Obstetrics & Gynecology

## 2016-07-03 VITALS — BP 120/80 | HR 104 | Resp 22 | Ht 65.0 in | Wt 199.2 lb

## 2016-07-03 DIAGNOSIS — R922 Inconclusive mammogram: Secondary | ICD-10-CM | POA: Diagnosis not present

## 2016-07-03 DIAGNOSIS — Z803 Family history of malignant neoplasm of breast: Secondary | ICD-10-CM

## 2016-07-03 NOTE — Progress Notes (Signed)
GYNECOLOGY  VISIT   HPI: 61 y.o. G0P0 Single Caucasian female here for breast check.  Her mother has breast cancer and desires every six month breast check.  Reports she's had issues the past few months with a daily, chronic cough.  She has tried medications for reflux and allergies and it is better.  Being followed by a PCP in [place].  CXR planned  Reports depression has been severe.  Was having significant suicidal ideations last year.  She had devised a plan for suicide and gave holiday gifts that felt like "final" gifts to her.  However, she is doing much better.  Has been doing ketamine infusion every two weeks.  She reports she is "off protocol" but is finally doing better and feels like this has been a "life saver".  Bought a townhome in [place].  Doesn't have to sell her place right now.  Working on getting her new home fixed up and her old place ready for selling.  Living with her mother most of the time in [place].  Moved in with her when her depression was so severe.  Requests having a diagnostic MMG with her next one due to breast density and anxiety about breast cancer.  States she just couldn't handle another medical issue at this time.  Although, this may be more than she needs for screening, I understand her anxiety given the past year.  Will schedule this for her.  GYNECOLOGIC HISTORY: Patient's last menstrual period was 12/31/2008. Contraception: PMP Menopausal hormone therapy: none  Patient Active Problem List   Diagnosis Date Noted  . Common migraine with intractable migraine 01/20/2016  . Acute confusional state 01/20/2016  . Chronic insomnia 01/20/2016  . Osteoporosis 10/05/2014  . Dense breasts 04/15/2014  . Family history of breast cancer in first degree relative 04/15/2014  . MDD (major depressive disorder), recurrent episode, severe ([place]) 04/08/2013    Past Medical History:  Diagnosis Date  . Anxiety   . Basal cell carcinoma of face    forehead  . CFS  (chronic fatigue syndrome)   . Chronic insomnia 01/20/2016  . Common migraine with intractable migraine 01/20/2016  . Depression   . Fibromyalgia   . Hematuria    neg eval Dr. Joelyn Oms, 2000  . Migraine   . Renal cyst   . Sciatica    bulging L5-S1  . Tachycardia    negative stress test    Past Surgical History:  Procedure Laterality Date  . MOHS SURGERY  9/08   Dr Eula Fried    MEDS:  Reviewed in EPIC and UTD  ALLERGIES: Dramamine ii [meclizine hcl]; Erythromycin; and Sulfa antibiotics  Family History  Problem Relation Age of Onset  . Colon cancer Father 18       . Depression Father   . Breast cancer Mother 21    her 2 neu+  . Hypertension Mother   . Heart Problems Mother     pacemaker placed  . Heart disease Maternal Grandmother   . Heart disease Paternal Grandmother   . Breast cancer Maternal Aunt     maternal great aunt    SH:  Single, non smoker  Review of Systems  All other systems reviewed and are negative.   PHYSICAL EXAMINATION:    BP 120/80 (BP Location: Right Arm, Patient Position: Sitting, Cuff Size: Normal)   Pulse (!) 104   Resp (!) 22   Ht 5\' 5"  (1.651 m)   Wt 199 lb 3.2 oz (90.4 kg)  LMP 12/31/2008   BMI 33.15 kg/m     General appearance: alert, cooperative and appears stated age Neck: no adenopathy, supple, symmetrical, trachea midline and thyroid normal to inspection and palpation CV:  Regular rate and rhythm Lungs:  clear to auscultation, no wheezes, rales or rhonchi, symmetric air entry Breasts: normal appearance, no masses or tenderness  Chaperone was present for exam.  Assessment: Family hx of breast cancer Depression with suicidal ideations this past year Grade C breast density  Plan: Repeat exam in six months.  Will have AEX then. Diagnostic MMG scheduled.   ~15 minutes spent with patient >50% of time was in face to face discussion of above.

## 2016-07-03 NOTE — Progress Notes (Signed)
Patient scheduled while in office. Spoke with Janace Hoard at [facility]. Patient scheduled for bilateral diagnostic MMG and ultrasound on 07/19/16 at 8:15 am. Patient verbalizes understanding and is agreeable. Written order to Dr. Sabra Heck for signature and fax.

## 2016-08-02 ENCOUNTER — Encounter: Payer: Self-pay | Admitting: Obstetrics & Gynecology

## 2017-01-11 ENCOUNTER — Other Ambulatory Visit (HOSPITAL_COMMUNITY)
Admission: RE | Admit: 2017-01-11 | Discharge: 2017-01-11 | Disposition: A | Discharge status: Home / Self Care | Payer: Medicare Other | Source: Ambulatory Visit | Attending: Obstetrics & Gynecology | Admitting: Obstetrics & Gynecology

## 2017-01-11 ENCOUNTER — Ambulatory Visit (INDEPENDENT_AMBULATORY_CARE_PROVIDER_SITE_OTHER): Payer: Medicare Other | Admitting: Obstetrics & Gynecology

## 2017-01-11 ENCOUNTER — Encounter: Payer: Self-pay | Admitting: Obstetrics & Gynecology

## 2017-01-11 VITALS — BP 114/62 | HR 72 | Resp 16 | Ht 64.25 in | Wt 191.0 lb

## 2017-01-11 DIAGNOSIS — Z01419 Encounter for gynecological examination (general) (routine) without abnormal findings: Secondary | ICD-10-CM | POA: Diagnosis not present

## 2017-01-11 DIAGNOSIS — Z124 Encounter for screening for malignant neoplasm of cervix: Secondary | ICD-10-CM | POA: Insufficient documentation

## 2017-01-11 NOTE — Progress Notes (Signed)
61 y.o. G0P0 SingleCaucasianF here for annual exam.  Denies vaginal bleeding.  Hosting friends during the storm.  Happy to be able to share her home.  Patient's last menstrual period was 12/31/2008.          Sexually active: No.  The current method of family planning is post menopausal status.    Exercising: No.  The patient does not participate in regular exercise at present. Smoker:  no  Health Maintenance: Pap:  01/02/16 negative, HR HPV negative, 10/26/14 negative  History of abnormal Pap:  no MMG:  07/26/16 [place] right breast- BIRADS 1 negative  Colonoscopy:  12/08/14 normal- repeat 5 years  BMD:   08/11/14 osteoporosis  TDaP:  02/15/11  Pneumonia vaccine(s):  never Zostavax:   never Hep C testing: 01/02/16 negative  Screening Labs: discuss with provider, Hb today: same, Urine today: has sample if needed   reports that she has never smoked. She has never used smokeless tobacco. She reports that she does not drink alcohol or use drugs.  Past Medical History:  Diagnosis Date  . Anxiety   . Basal cell carcinoma of face    forehead  . CFS (chronic fatigue syndrome)   . Chronic insomnia 01/20/2016  . Common migraine with intractable migraine 01/20/2016  . Depression   . Fibromyalgia   . Hematuria    neg eval Dr. Joelyn Oms, 2000  . Migraine   . Renal cyst   . Sciatica    bulging L5-S1  . Tachycardia    negative stress test    Past Surgical History:  Procedure Laterality Date  . MOHS SURGERY  9/08   Dr Eula Fried    Current Outpatient Prescriptions  Medication Sig Dispense Refill  . aspirin EC 81 MG tablet Take 81 mg by mouth daily.    . B Complex Vitamins (VITAMIN B COMPLEX PO) Take by mouth daily.    . bisoprolol (ZEBETA) 5 MG tablet Take 5 mg by mouth daily.  3  . Brexpiprazole (REXULTI) 0.5 MG TABS Take 0.5 tablets by mouth daily.    Marland Kitchen buPROPion (WELLBUTRIN SR) 100 MG 12 hr tablet     . cetirizine (ZYRTEC) 10 MG tablet Take 10 mg by mouth as needed.     . clonazePAM  (KLONOPIN) 1 MG tablet Take 1 mg by mouth at bedtime and may repeat dose one time if needed.    . Coenzyme Q10 (CO Q 10 PO) Take by mouth.    . desvenlafaxine (PRISTIQ) 100 MG 24 hr tablet     . desvenlafaxine (PRISTIQ) 50 MG 24 hr tablet Take 50 mg by mouth daily.    Marland Kitchen dextroamphetamine (DEXTROSTAT) 5 MG tablet TAKE 1 AND 1/2 TABLETS BY MOUTH TWICE A DAY  0  . ESOMEPRAZOLE STRONTIUM PO Take 1 capsule by mouth at bedtime.    . fish oil-omega-3 fatty acids 1000 MG capsule Take 3 g by mouth daily.     . Melatonin 5 MG TABS Take by mouth.    Marland Kitchen MILK THISTLE PO Take by mouth.    . NON FORMULARY Green drink with greens    . NON FORMULARY 2 capsules daily. Algae Cal Plus    . Probiotic Product (PROBIOTIC DAILY) CAPS Take by mouth daily.    . promethazine (PHENERGAN) 25 MG tablet as needed.     . rizatriptan (MAXALT) 10 MG tablet as needed.    . traZODone (DESYREL) 50 MG tablet Take 50 mg by mouth at bedtime.    . TURMERIC  PO Take by mouth daily.     No current facility-administered medications for this visit.     Family History  Problem Relation Age of Onset  . Colon cancer Father 36          . Depression Father   . Breast cancer Mother 9       her 2 neu+  . Hypertension Mother   . Heart Problems Mother        pacemaker placed  . Heart disease Maternal Grandmother   . Heart disease Paternal Grandmother   . Breast cancer Maternal Aunt        maternal great aunt    ROS:  Pertinent items are noted in HPI.  Otherwise, a comprehensive ROS was negative.  Exam:   BP 114/62 (BP Location: Right Arm, Patient Position: Sitting, Cuff Size: Normal)   Pulse 72   Resp 16   Ht 5' 4.25" (1.632 m)   Wt 191 lb (86.6 kg)   LMP 12/31/2008   BMI 32.53 kg/m     Height: 5' 4.25" (163.2 cm)  Ht Readings from Last 3 Encounters:  01/11/17 5' 4.25" (1.632 m)  07/03/16 5\' 5"  (1.651 m)  01/20/16 5\' 5"  (1.651 m)    General appearance: alert, cooperative and appears stated age Head: Normocephalic,  without obvious abnormality, atraumatic Neck: no adenopathy, supple, symmetrical, trachea midline and thyroid normal to inspection and palpation Lungs: clear to auscultation bilaterally Breasts: normal appearance, no masses or tenderness Heart: regular rate and rhythm Abdomen: soft, non-tender; bowel sounds normal; no masses,  no organomegaly Extremities: extremities normal, atraumatic, no cyanosis or edema Skin: Skin color, texture, turgor normal. No rashes or lesions Lymph nodes: Cervical, supraclavicular, and axillary nodes normal. No abnormal inguinal nodes palpated Neurologic: Grossly normal   Pelvic: External genitalia:  no lesions              Urethra:  normal appearing urethra with no masses, tenderness or lesions              Bartholins and Skenes: normal                 Vagina: normal appearing vagina with normal color and discharge, no lesions              Cervix: no lesions              Pap taken: Yes.   Bimanual Exam:  Uterus:  normal size, contour, position, consistency, mobility, non-tender              Adnexa: normal adnexa and no mass, fullness, tenderness               Rectovaginal: Confirms               Anus:  normal sphincter tone, no lesions  Chaperone was present for exam.  A:  Well Woman with normal exam PMP, no HRT Depression, h/o ECT Family hx of breast cancer in her mother Dense breasts  P:   Mammogram guidelines reviewed pap smear obtained today Lab work done with Dr. Kathyrn Lass Return 6 months for breast check--pt desires every six month breast check Return annually or prn

## 2017-01-14 ENCOUNTER — Telehealth: Payer: Self-pay

## 2017-01-14 NOTE — Telephone Encounter (Signed)
Comment should read PMP, no HRT.   Ok for this to be changed.  Please thank her for the phone call.

## 2017-01-14 NOTE — Telephone Encounter (Signed)
Spoke with Peter Congo from [facility]. Peter Congo states note on pap says "PMP, no HPV." States a lot of comments she receives for clinical history state "PMP, no HRT." Wants to clarify what [name] would like on the report. Advised per review of records patient does not have a history of HPV and is not on HRT. Will review with MD and return call to [place] at [phone].

## 2017-01-14 NOTE — Telephone Encounter (Signed)
Spoke with Peter Congo with [facility]. Advised of message from [place]. Peter Congo will change comment to PMP, no HRT at this time.  Will close encounter.

## 2017-01-15 LAB — CYTOLOGY - PAP: Diagnosis: NEGATIVE

## 2017-07-08 ENCOUNTER — Telehealth: Payer: Self-pay | Admitting: Emergency Medicine

## 2017-07-08 ENCOUNTER — Encounter: Payer: Self-pay | Admitting: Obstetrics & Gynecology

## 2017-07-08 ENCOUNTER — Telehealth: Payer: Self-pay | Admitting: Obstetrics & Gynecology

## 2017-07-08 ENCOUNTER — Ambulatory Visit: Payer: BC Managed Care – PPO | Admitting: Obstetrics & Gynecology

## 2017-07-08 NOTE — Telephone Encounter (Signed)
Patient sent the following correspondence through [place]. Routing to triage to assist patient with request.  ----- Message from [place], Generic sent at 07/08/2017 10:19 AM EDT -----    Dr. Sabra Heck. I apologize for missing my six month check up today. I will reschedule ASAP to see you. I had an awful migraine with nausea last night and just couldnt come this morning. I also am having some esophagus issues that are making me physically sick. I do have a question please. Do you have a recommendation for a internist or family care doctor? I am seeing a Dr at [facility] and she is nice but she doesnt really listen. The place is just so huge She just writes a prescription while you havent even finished a few sentences to her. Dr. Minna Antis was proactive rather than reactive and he listened. He would have done a endoscopy a year ago. I do like female Drs better... more comfortable. But I am really beginning to feel very anxious about a Dr. I can trust. I know with Medicare it is tough but I have to try. I have such respect for you and hope ok to ask. Hope your family is doing well. Grateful. Allison Cruz

## 2017-07-08 NOTE — Telephone Encounter (Signed)
Copied from [place] [id number]. Topic: General - Other >> Jul 08, 2017  1:53 PM Oneta Rack wrote: Relation to pt: self Call back number:[phone]  Reason for call:  [name], MD gynecologist referred patient to establish care with Dr. Birdie Riddle, please advise >> Jul 08, 2017  1:55 PM Oneta Rack wrote: Relation to pt: self Call back number:[phone]  Reason for call:  [name], MD gynecologist referred patient to establish care with Dr. Birdie Riddle, please advise

## 2017-07-08 NOTE — Telephone Encounter (Signed)
Patient canceled her 6 month breast check due to illness. Patient will call later to reschedule.

## 2017-07-08 NOTE — Progress Notes (Deleted)
GYNECOLOGY  VISIT  CC:   ***  HPI: 62 y.o. G0P0 Single Caucasian female here for ***.  GYNECOLOGIC HISTORY: Patient's last menstrual period was 12/31/2008. Contraception: post menopausal  Menopausal hormone therapy: none  Patient Active Problem List   Diagnosis Date Noted  . Common migraine with intractable migraine 01/20/2016  . Chronic insomnia 01/20/2016  . Fibromyalgia 10/12/2015  . Generalized anxiety disorder 08/31/2015  . Osteoporosis 10/05/2014  . Dense breasts 04/15/2014  . Family history of breast cancer in first degree relative 04/15/2014  . MDD (major depressive disorder), recurrent episode, severe ([place]) 04/08/2013    Past Medical History:  Diagnosis Date  . Anxiety   . Basal cell carcinoma of face    forehead  . CFS (chronic fatigue syndrome)   . Chronic insomnia 01/20/2016  . Common migraine with intractable migraine 01/20/2016  . Depression   . Fibromyalgia   . Hematuria    neg eval Dr. Joelyn Oms, 2000  . Migraine   . Renal cyst   . Sciatica    bulging L5-S1  . Tachycardia    negative stress test    Past Surgical History:  Procedure Laterality Date  . MOHS SURGERY  9/08   Dr Eula Fried    MEDS:   Current Outpatient Medications on File Prior to Visit  Medication Sig Dispense Refill  . aspirin EC 81 MG tablet Take 81 mg by mouth daily.    . B Complex Vitamins (VITAMIN B COMPLEX PO) Take by mouth daily.    . bisoprolol (ZEBETA) 5 MG tablet Take 5 mg by mouth daily.  3  . Brexpiprazole (REXULTI) 0.5 MG TABS Take 0.5 tablets by mouth daily.    Marland Kitchen buPROPion (WELLBUTRIN SR) 100 MG 12 hr tablet     . cetirizine (ZYRTEC) 10 MG tablet Take 10 mg by mouth as needed.     . clonazePAM (KLONOPIN) 1 MG tablet Take 1 mg by mouth at bedtime and may repeat dose one time if needed.    . Coenzyme Q10 (CO Q 10 PO) Take by mouth.    . desvenlafaxine (PRISTIQ) 100 MG 24 hr tablet     . desvenlafaxine (PRISTIQ) 50 MG 24 hr tablet Take 50 mg by mouth daily.    Marland Kitchen  dextroamphetamine (DEXTROSTAT) 5 MG tablet TAKE 1 AND 1/2 TABLETS BY MOUTH TWICE A DAY  0  . ESOMEPRAZOLE STRONTIUM PO Take 1 capsule by mouth at bedtime.    . fish oil-omega-3 fatty acids 1000 MG capsule Take 3 g by mouth daily.     . Melatonin 5 MG TABS Take by mouth.    Marland Kitchen MILK THISTLE PO Take by mouth.    . NON FORMULARY Green drink with greens    . NON FORMULARY 2 capsules daily. Algae Cal Plus    . Probiotic Product (PROBIOTIC DAILY) CAPS Take by mouth daily.    . promethazine (PHENERGAN) 25 MG tablet as needed.     . rizatriptan (MAXALT) 10 MG tablet as needed.    . traZODone (DESYREL) 50 MG tablet Take 50 mg by mouth at bedtime.    . TURMERIC PO Take by mouth daily.     No current facility-administered medications on file prior to visit.     ALLERGIES: Dramamine ii [meclizine hcl]; Erythromycin; and Sulfa antibiotics  Family History  Problem Relation Age of Onset  . Colon cancer Father 4          . Depression Father   . Breast cancer Mother 51  her 2 neu+  . Hypertension Mother   . Heart Problems Mother        pacemaker placed  . Heart disease Maternal Grandmother   . Heart disease Paternal Grandmother   . Breast cancer Maternal Aunt        maternal great aunt    SH:  ***  ROS  PHYSICAL EXAMINATION:    LMP 12/31/2008     General appearance: alert, cooperative and appears stated age Neck: no adenopathy, supple, symmetrical, trachea midline and thyroid {CHL AMB PHY EX THYROID NORM DEFAULT:[phone]::"normal to inspection and palpation"} CV:  {Exam; heart brief:31539} Lungs:  {pe lungs ob:314451::"clear to auscultation, no wheezes, rales or rhonchi, symmetric air entry"} Breasts: {Exam; breast:13139::"normal appearance, no masses or tenderness"} Abdomen: soft, non-tender; bowel sounds normal; no masses,  no organomegaly  Pelvic: External genitalia:  no lesions              Urethra:  normal appearing urethra with no masses, tenderness or lesions               Bartholins and Skenes: normal                 Vagina: normal appearing vagina with normal color and discharge, no lesions              Cervix: {CHL AMB PHY EX CERVIX NORM DEFAULT:[id number]::"no lesions"}              Bimanual Exam:  Uterus:  {CHL AMB PHY EX UTERUS NORM DEFAULT:[phone]::"normal size, contour, position, consistency, mobility, non-tender"}              Adnexa: {CHL AMB PHY EX ADNEXA NO MASS DEFAULT:[phone]::"no mass, fullness, tenderness"}              Rectovaginal: {yes no:314532}.  Confirms.              Anus:  normal sphincter tone, no lesions  Chaperone was present for exam.  Assessment: ***  Plan: ***   ~{NUMBERS; -10-45 JOINT ROM:10287} minutes spent with patient >50% of time was in face to face discussion of above.

## 2017-07-08 NOTE — Telephone Encounter (Signed)
Maybe Dr. Birdie Riddle in [place].  Ms. Viscomi lives in that direction.

## 2017-07-08 NOTE — Telephone Encounter (Signed)
Thanks for the information.  Ok to close encounter.

## 2017-07-08 NOTE — Telephone Encounter (Signed)
Spoke with patient. Provided information as seen below from [place]. Provided information to Dr.Tabori's office. Patient will call to schedule an appointment.  Routing to provider for final review. Patient agreeable to disposition. Will close encounter.

## 2017-07-08 NOTE — Telephone Encounter (Signed)
Unfortunately my new pt appts are scheduling out until fall and given her medical issues, she likely needs to be seen sooner than that.  Also, I don't prescribe a lot of the medications she takes- Clonazepam and Dextroamphetamine in particular

## 2017-07-08 NOTE — Telephone Encounter (Signed)
Routing to [name] for review and advise. 

## 2017-07-09 ENCOUNTER — Encounter: Payer: Self-pay | Admitting: Obstetrics & Gynecology

## 2017-07-09 NOTE — Telephone Encounter (Signed)
LMOVM advising pt that KT was not taking any NP at this time.

## 2017-07-10 ENCOUNTER — Telehealth: Payer: Self-pay | Admitting: Obstetrics & Gynecology

## 2017-07-10 NOTE — Telephone Encounter (Signed)
Message   ----- Message from [place], Generic sent at 07/09/2017 7:16 PM EDT -----    They called me today. Was holding out hope. Yet thank you so much for information. Hopefully that will work out. Appreciate. Shirl Harris.   ----- Message -----  From: [name], MD  Sent: 07/09/2017 5:14 PM EDT  To: [employer]  Subject: new PCP  Ms. Dekoning,  I just learned today that Dr. Birdie Riddle has (or will soon have) a new provider in her office. It is a female--Dr. Billey Chang. She was at the [facility] on [address] but is moving to the [place] [facility] [place] office. I do not know her but I do have several patients who see her and like her very much. Hopefully that will work out for your. Thanks.    Edwinna Areola

## 2017-07-15 ENCOUNTER — Ambulatory Visit: Payer: BC Managed Care – PPO | Admitting: Obstetrics & Gynecology

## 2017-07-15 ENCOUNTER — Encounter: Payer: Self-pay | Admitting: Obstetrics & Gynecology

## 2017-07-15 ENCOUNTER — Telehealth: Payer: Self-pay | Admitting: Obstetrics & Gynecology

## 2017-07-15 NOTE — Telephone Encounter (Addendum)
Patient called and cancelled her appointment today for a six month breast recheck. She said she is not feeling well and will call back to reschedule when she's feeling better.  Patient sent the following correspondence through [place]. Routing to Dr. Sabra Heck request.  ----- Message from [place] [address], Generic sent at 07/15/2017 8:13 AM EDT -----    Hey Dr. Sabra Heck. I have never missed an app with you until last week and now today. I am so very sorry and will insist on paying when I call after this. I so need this appointment for peace of mind. I am just so ill. I had to go to urgent care last Thursday for a sinus infection and tick bite and cyst. On antibiotics.   I am going to some Dr and getting a referral so I can go see Dr. Collene Mares. Esphogas and stomach issues. Last night and still I have a very upset stomach and cramps and can't make it.   I am not going to reschedule until I can get in to Dr. Collene Mares and hopefully find out what is causing all these issues that are painful and scare me. Thank you for putting up with me. Have a wonderful day. Allison Cruz

## 2017-07-15 NOTE — Telephone Encounter (Signed)
Thank you for the message.  Ok to close encounter.

## 2017-07-31 ENCOUNTER — Encounter: Payer: Self-pay | Admitting: Obstetrics & Gynecology

## 2017-08-01 DIAGNOSIS — R5382 Chronic fatigue, unspecified: Secondary | ICD-10-CM

## 2017-08-01 DIAGNOSIS — K219 Gastro-esophageal reflux disease without esophagitis: Secondary | ICD-10-CM | POA: Insufficient documentation

## 2017-08-01 DIAGNOSIS — K5790 Diverticulosis of intestine, part unspecified, without perforation or abscess without bleeding: Secondary | ICD-10-CM | POA: Insufficient documentation

## 2017-08-01 DIAGNOSIS — F331 Major depressive disorder, recurrent, moderate: Secondary | ICD-10-CM | POA: Insufficient documentation

## 2017-08-01 DIAGNOSIS — Z789 Other specified health status: Secondary | ICD-10-CM | POA: Insufficient documentation

## 2017-08-01 HISTORY — DX: Gastro-esophageal reflux disease without esophagitis: K21.9

## 2017-08-01 HISTORY — DX: Diverticulosis of intestine, part unspecified, without perforation or abscess without bleeding: K57.90

## 2017-08-01 HISTORY — DX: Chronic fatigue, unspecified: R53.82

## 2017-08-28 ENCOUNTER — Other Ambulatory Visit: Payer: Self-pay

## 2017-08-28 ENCOUNTER — Telehealth: Payer: Self-pay | Admitting: Obstetrics & Gynecology

## 2017-08-28 ENCOUNTER — Encounter: Payer: Self-pay | Admitting: Obstetrics & Gynecology

## 2017-08-28 ENCOUNTER — Ambulatory Visit (INDEPENDENT_AMBULATORY_CARE_PROVIDER_SITE_OTHER): Payer: Medicare Other | Admitting: Obstetrics & Gynecology

## 2017-08-28 ENCOUNTER — Encounter (HOSPITAL_BASED_OUTPATIENT_CLINIC_OR_DEPARTMENT_OTHER): Payer: Self-pay | Admitting: Adult Health

## 2017-08-28 ENCOUNTER — Emergency Department (HOSPITAL_BASED_OUTPATIENT_CLINIC_OR_DEPARTMENT_OTHER)
Admission: EM | Admit: 2017-08-28 | Discharge: 2017-08-28 | Disposition: A | Discharge status: Home / Self Care | Payer: Medicare Other | Attending: Emergency Medicine | Admitting: Emergency Medicine

## 2017-08-28 VITALS — BP 122/80 | HR 72 | Resp 16 | Ht 64.25 in | Wt 202.4 lb

## 2017-08-28 DIAGNOSIS — Z79899 Other long term (current) drug therapy: Secondary | ICD-10-CM | POA: Insufficient documentation

## 2017-08-28 DIAGNOSIS — Z23 Encounter for immunization: Secondary | ICD-10-CM | POA: Insufficient documentation

## 2017-08-28 DIAGNOSIS — Z7982 Long term (current) use of aspirin: Secondary | ICD-10-CM | POA: Diagnosis not present

## 2017-08-28 DIAGNOSIS — Z203 Contact with and (suspected) exposure to rabies: Secondary | ICD-10-CM | POA: Diagnosis not present

## 2017-08-28 DIAGNOSIS — Z803 Family history of malignant neoplasm of breast: Secondary | ICD-10-CM | POA: Diagnosis not present

## 2017-08-28 DIAGNOSIS — R922 Inconclusive mammogram: Secondary | ICD-10-CM | POA: Diagnosis not present

## 2017-08-28 MED ORDER — RABIES VACCINE, PCEC IM SUSR
1.0000 mL | Freq: Once | INTRAMUSCULAR | Status: AC
  Administered 2017-08-28: 1 mL via INTRAMUSCULAR
  Filled 2017-08-28: qty 1

## 2017-08-28 MED ORDER — RABIES IMMUNE GLOBULIN 150 UNIT/ML IM INJ
20.0000 [IU]/kg | INJECTION | Freq: Once | INTRAMUSCULAR | Status: AC
  Administered 2017-08-28: 1800 [IU] via INTRAMUSCULAR
  Filled 2017-08-28: qty 12

## 2017-08-28 NOTE — Discharge Instructions (Signed)
Follow-up for your follow-up rabies vaccines.

## 2017-08-28 NOTE — Progress Notes (Signed)
GYNECOLOGY  VISIT  CC:   Breast check, breast density  HPI: 62 y.o. G0P0 Single Caucasian female here for breast recheck.  Has family hx of breast disease and breast density.  No new breast changes, nipple discharge, skin changes.  Had colonoscopy and endoscopy with Dr. Collene Mares.  Colonoscopy was normal but she had five gastric polyps.  These were benign.  Being vegan really helps.    Saw new PCP, Dr. Leafy Ro, May 2nd.  Renal function was normal with blood work in the beginning of May and there was no protein in her urine.  Will repeat blood work in September.    GYNECOLOGIC HISTORY: Patient's last menstrual period was 12/31/2008. Contraception: post menopausal  Menopausal hormone therapy: none  Patient Active Problem List   Diagnosis Date Noted  . Common migraine with intractable migraine 01/20/2016  . Chronic insomnia 01/20/2016  . Fibromyalgia 10/12/2015  . Generalized anxiety disorder 08/31/2015  . Osteoporosis 10/05/2014  . Dense breasts 04/15/2014  . Family history of breast cancer in first degree relative 04/15/2014  . MDD (major depressive disorder), recurrent episode, severe ([place]) 04/08/2013    Past Medical History:  Diagnosis Date  . Anxiety   . Basal cell carcinoma of face    forehead  . CFS (chronic fatigue syndrome)   . Chronic insomnia 01/20/2016  . Common migraine with intractable migraine 01/20/2016  . Depression   . Fibromyalgia   . Hematuria    neg eval Dr. Joelyn Oms, 2000  . Migraine   . Renal cyst   . Sciatica    bulging L5-S1  . Tachycardia    negative stress test    Past Surgical History:  Procedure Laterality Date  . MOHS SURGERY  9/08   Dr Eula Fried    MEDS:   Current Outpatient Medications on File Prior to Visit  Medication Sig Dispense Refill  . aspirin EC 81 MG tablet Take 81 mg by mouth daily.    . B Complex Vitamins (VITAMIN B COMPLEX PO) Take by mouth daily.    . bisoprolol (ZEBETA) 5 MG tablet Take 5 mg by mouth daily.  3  . Brexpiprazole  (REXULTI) 0.5 MG TABS Take 0.5 tablets by mouth daily.    Marland Kitchen buPROPion (WELLBUTRIN SR) 100 MG 12 hr tablet     . clonazePAM (KLONOPIN) 1 MG tablet Take 1 mg by mouth at bedtime and may repeat dose one time if needed.    . Coenzyme Q10 (CO Q 10 PO) Take by mouth.    . desvenlafaxine (PRISTIQ) 100 MG 24 hr tablet     . desvenlafaxine (PRISTIQ) 50 MG 24 hr tablet Take 50 mg by mouth daily.    Marland Kitchen ESOMEPRAZOLE STRONTIUM PO Take 1 capsule by mouth at bedtime.    . fish oil-omega-3 fatty acids 1000 MG capsule Take 3 g by mouth daily.     . Melatonin 5 MG TABS Take by mouth.    . methylphenidate (RITALIN) 5 MG tablet Take 2 tablets by mouth 4 (four) times daily as needed.  0  . MILK THISTLE PO Take by mouth.    . NON FORMULARY Green drink with greens    . NON FORMULARY 2 capsules daily. Algae Cal Plus    . Probiotic Product (PROBIOTIC DAILY) CAPS Take by mouth daily.    . promethazine (PHENERGAN) 25 MG tablet as needed.     . rizatriptan (MAXALT) 10 MG tablet as needed.    . traZODone (DESYREL) 50 MG tablet Take 50 mg  by mouth at bedtime.    . TURMERIC PO Take by mouth daily.     No current facility-administered medications on file prior to visit.     ALLERGIES: Dramamine ii [meclizine hcl]; Erythromycin; Sulfa antibiotics; and Dimenhydrinate  Family History  Problem Relation Age of Onset  . Colon cancer Father 3          . Depression Father   . Breast cancer Mother 35       her 2 neu+  . Hypertension Mother   . Heart Problems Mother        pacemaker placed  . Heart disease Maternal Grandmother   . Heart disease Paternal Grandmother   . Breast cancer Maternal Aunt        maternal great aunt    SH:  Single, non smoker  Review of Systems  HENT: Positive for congestion.        Gum disease   Genitourinary:       Loss of urine spontaneously   Musculoskeletal: Positive for myalgias.  Neurological: Positive for headaches.  Psychiatric/Behavioral: Positive for depression. The patient  is nervous/anxious.   All other systems reviewed and are negative.   PHYSICAL EXAMINATION:    BP 122/80 (BP Location: Right Arm, Patient Position: Sitting, Cuff Size: Large)   Pulse 72   Resp 16   Ht 5' 4.25" (1.632 m)   Wt 202 lb 6.4 oz (91.8 kg)   LMP 12/31/2008   BMI 34.47 kg/m     Physical Exam  Constitutional: She appears well-developed and well-nourished.  Neck: Neck supple. No thyromegaly present.  Respiratory: Right breast exhibits no inverted nipple, no mass, no nipple discharge, no skin change and no tenderness. Left breast exhibits no inverted nipple, no mass, no nipple discharge, no skin change and no tenderness. Breasts are symmetrical.  Lymphadenopathy:    She has no cervical adenopathy.  Skin: Skin is warm and dry.   Chaperone was present for exam.  Assessment: Family hx of breast cancer Breast density  Plan: Having every six month breast exams (one of these is with AEX) Desires diagnostic MMG for screening so has results the same day.  Will schedule this before pt leaves today.   ~15 minutes spent with patient >50% of time was in face to face discussion of above.

## 2017-08-28 NOTE — ED Triage Notes (Signed)
PResents asking for rabies injections due to a bat in her home. She woke up to find it in her kitchen flying around.

## 2017-08-28 NOTE — Patient Instructions (Signed)
[employer].  Marcine Matar, MD.

## 2017-08-28 NOTE — ED Provider Notes (Signed)
[facility] EMERGENCY DEPARTMENT Provider Note   CSN: [id number] Arrival date & time: 08/28/17  1805     History   Chief Complaint Chief Complaint  Patient presents with  . Rabies Injection    HPI GLORISTINE TURRUBIATES is a 62 y.o. female.  HPI 62 year old female with no pertinent past medical history presents to the ED for rabies vaccine as that she is been living in a house with a bat.  Unknown how long the bat has been there for.  States that animal control was called out and was unable to find a bat.  Both animal control primary care and recommended patient get the rabies vaccines.  Patient denies any known bites.  States her tetanus shot is up-to-date.  Denies any associated symptoms including fevers or chills, nausea or vomiting. Past Medical History:  Diagnosis Date  . Anxiety   . Basal cell carcinoma of face    forehead  . CFS (chronic fatigue syndrome)   . Chronic insomnia 01/20/2016  . Common migraine with intractable migraine 01/20/2016  . Depression   . Fibromyalgia   . Hematuria    neg eval Dr. Joelyn Oms, 2000  . Migraine   . Renal cyst   . Sciatica    bulging L5-S1  . Tachycardia    negative stress test    Patient Active Problem List   Diagnosis Date Noted  . Chronic fatigue 08/01/2017  . Consumes a vegan diet 08/01/2017  . Diverticulosis 08/01/2017  . GERD (gastroesophageal reflux disease) 08/01/2017  . Common migraine with intractable migraine 01/20/2016  . Chronic insomnia 01/20/2016  . Fibromyalgia 10/12/2015  . Generalized anxiety disorder 08/31/2015  . Osteoporosis 10/05/2014  . Dense breasts 04/15/2014  . Family history of breast cancer in first degree relative 04/15/2014  . MDD (major depressive disorder), recurrent episode, severe ([place]) 04/08/2013    Past Surgical History:  Procedure Laterality Date  . MOHS SURGERY  9/08   Dr Eula Fried     OB History    Gravida  0   Para      Term      Preterm      AB      Living        SAB      TAB      Ectopic      Multiple      Live Births               Home Medications    Prior to Admission medications   Medication Sig Start Date End Date Taking? Authorizing Provider  aspirin EC 81 MG tablet Take 81 mg by mouth daily.    [provider]  B Complex Vitamins (VITAMIN B COMPLEX PO) Take by mouth daily.    [provider]  bisoprolol (ZEBETA) 5 MG tablet Take 5 mg by mouth daily. 10/31/16   [provider]  Brexpiprazole (REXULTI) 0.5 MG TABS Take 0.5 tablets by mouth daily.    [provider]  buPROPion (WELLBUTRIN SR) 100 MG 12 hr tablet     [provider]  clonazePAM (KLONOPIN) 1 MG tablet Take 1 mg by mouth at bedtime and may repeat dose one time if needed.    [provider]  Coenzyme Q10 (CO Q 10 PO) Take by mouth.    [provider]  desvenlafaxine (PRISTIQ) 100 MG 24 hr tablet     [provider]  desvenlafaxine (PRISTIQ) 50 MG 24 hr tablet Take 50 mg  by mouth daily.    [provider]  ESOMEPRAZOLE STRONTIUM PO Take 1 capsule by mouth at bedtime.    [provider]  fish oil-omega-3 fatty acids 1000 MG capsule Take 3 g by mouth daily.     [provider]  Melatonin 5 MG TABS Take by mouth.    [provider]  methylphenidate (RITALIN) 5 MG tablet Take 2 tablets by mouth 4 (four) times daily as needed. 08/23/17   [provider]  MILK THISTLE PO Take by mouth.    [provider]  NON FORMULARY Green drink with greens    [provider]  NON FORMULARY 2 capsules daily. Algae Cal Plus    [provider]  Probiotic Product (PROBIOTIC DAILY) CAPS Take by mouth daily.    [provider]  promethazine (PHENERGAN) 25 MG tablet as needed.  07/04/12   [provider]  rizatriptan (MAXALT) 10 MG tablet as needed. 03/19/13   [provider]  traZODone (DESYREL) 50 MG tablet Take 50 mg by mouth at  bedtime.    [provider]  TURMERIC PO Take by mouth daily.    [provider]    Family History Family History  Problem Relation Age of Onset  . Colon cancer Father 57          . Depression Father   . Breast cancer Mother 3       her 2 neu+  . Hypertension Mother   . Heart Problems Mother        pacemaker placed  . Heart disease Maternal Grandmother   . Heart disease Paternal Grandmother   . Breast cancer Maternal Aunt        maternal great aunt    Social History Social History   Tobacco Use  . Smoking status: Never Smoker  . Smokeless tobacco: Never Used  Substance Use Topics  . Alcohol use: No  . Drug use: No     Allergies   Dramamine ii [meclizine hcl]; Erythromycin; Sulfa antibiotics; and Dimenhydrinate   Review of Systems Review of Systems  All other systems reviewed and are negative.    Physical Exam Updated Vital Signs BP 129/85   Pulse 80   Temp 98.7 F (37.1 C) (Oral)   Resp 18   Ht 5\' 4"  (1.626 m)   Wt 91.6 kg (202 lb)   LMP 12/31/2008   SpO2 95%   BMI 34.67 kg/m   Physical Exam  Constitutional: She appears well-developed and well-nourished. No distress.  HENT:  Head: Normocephalic and atraumatic.  Eyes: Right eye exhibits no discharge. Left eye exhibits no discharge. No scleral icterus.  Neck: Normal range of motion.  Pulmonary/Chest: No respiratory distress.  Musculoskeletal: Normal range of motion.  Neurological: She is alert.  Skin: Skin is warm and dry. No rash noted. No pallor.  Psychiatric: Her behavior is normal. Judgment and thought content normal.  Nursing note and vitals reviewed.    ED Treatments / Results  Labs (all labs ordered are listed, but only abnormal results are displayed) Labs Reviewed - No data to display  EKG None  Radiology No results found.  Procedures Procedures (including critical care time)  Medications Ordered in ED Medications  rabies immune globulin (HYPERAB/KEDRAB)  injection 1,800 Units (has no administration in time range)  rabies vaccine (RABAVERT) injection 1 mL (has no administration in time range)     Initial Impression / Assessment and Plan / ED Course  I have reviewed  the triage vital signs and the nursing notes.  Pertinent labs & imaging results that were available during my care of the patient were reviewed by me and considered in my medical decision making (see chart for details).     Patient with exposure to bat unknown contact.  Animal control and primary care recommended patient receiving rabies vaccine for prophylaxis.  These will be given.  Tetanus shot is up-to-date.  Patient will need to follow-up with primary care or urgent care for subsequent vaccines.  Final Clinical Impressions(s) / ED Diagnoses   Final diagnoses:  Rabies exposure    ED Discharge Orders    None       Aaron Edelman 08/28/17 Doretha Imus, MD 08/29/17 1326

## 2017-08-28 NOTE — Progress Notes (Signed)
Patient scheduled while in office for bilateral DX MMG and [place], if needed, for family hx of BRCA and dense breast. Scheduled at [facility] on 09/04/17 at 10:30am. Order faxed to [facility]. Patient verbalizes understanding and is agreeable.

## 2017-08-28 NOTE — Telephone Encounter (Signed)
Patient sent the following correspondence through [place].   ----- Message from [place], Generic sent at 08/28/2017 2:22 PM EDT -----    Dr. Sabra Heck. I just wanted to tell you that I am so sorry I put you behind today. I should not have asked you to look at that place on my upper thigh. I let anxiety get the best of me and I apologize. I hope [facility]'s graduation is a wonderful happy time for your family! Blessings. Marlowe Kays

## 2017-08-28 NOTE — ED Notes (Signed)
Pt verbalizes understanding of d/c instructions and denies any further needs at this time. 

## 2017-08-29 ENCOUNTER — Encounter: Payer: Self-pay | Admitting: Obstetrics & Gynecology

## 2017-08-30 ENCOUNTER — Ambulatory Visit: Payer: BC Managed Care – PPO | Admitting: Obstetrics & Gynecology

## 2017-08-31 ENCOUNTER — Emergency Department (INDEPENDENT_AMBULATORY_CARE_PROVIDER_SITE_OTHER)
Admission: EM | Admit: 2017-08-31 | Discharge: 2017-08-31 | Disposition: A | Discharge status: Home / Self Care | Payer: Medicare Other | Source: Home / Self Care

## 2017-08-31 ENCOUNTER — Encounter: Payer: Self-pay | Admitting: Emergency Medicine

## 2017-08-31 DIAGNOSIS — Z2914 Encounter for prophylactic rabies immune globin: Secondary | ICD-10-CM

## 2017-08-31 DIAGNOSIS — Z203 Contact with and (suspected) exposure to rabies: Secondary | ICD-10-CM | POA: Diagnosis not present

## 2017-08-31 MED ORDER — RABIES VACCINE, PCEC IM SUSR
1.0000 mL | Freq: Once | INTRAMUSCULAR | Status: AC
  Administered 2017-08-31: 1 mL via INTRAMUSCULAR

## 2017-08-31 NOTE — ED Triage Notes (Signed)
Patient here for her 2nd rabies vaccine.

## 2017-09-04 ENCOUNTER — Other Ambulatory Visit: Payer: Self-pay

## 2017-09-04 ENCOUNTER — Encounter: Payer: Self-pay | Admitting: *Deleted

## 2017-09-04 ENCOUNTER — Emergency Department (INDEPENDENT_AMBULATORY_CARE_PROVIDER_SITE_OTHER)
Admission: EM | Admit: 2017-09-04 | Discharge: 2017-09-04 | Disposition: A | Discharge status: Home / Self Care | Payer: Medicare Other | Source: Home / Self Care

## 2017-09-04 DIAGNOSIS — Z2914 Encounter for prophylactic rabies immune globin: Secondary | ICD-10-CM

## 2017-09-04 DIAGNOSIS — Z203 Contact with and (suspected) exposure to rabies: Secondary | ICD-10-CM | POA: Diagnosis not present

## 2017-09-04 MED ORDER — RABIES VACCINE, PCEC IM SUSR
1.0000 mL | Freq: Once | INTRAMUSCULAR | Status: AC
  Administered 2017-09-04: 1 mL via INTRAMUSCULAR

## 2017-09-04 NOTE — ED Triage Notes (Signed)
Pt is here today for her day 7 rabies vaccination.

## 2017-09-11 ENCOUNTER — Emergency Department (INDEPENDENT_AMBULATORY_CARE_PROVIDER_SITE_OTHER)
Admission: EM | Admit: 2017-09-11 | Discharge: 2017-09-11 | Disposition: A | Discharge status: Home / Self Care | Payer: Medicare Other | Source: Home / Self Care

## 2017-09-11 DIAGNOSIS — Z23 Encounter for immunization: Secondary | ICD-10-CM | POA: Diagnosis not present

## 2017-09-11 DIAGNOSIS — Z203 Contact with and (suspected) exposure to rabies: Secondary | ICD-10-CM | POA: Diagnosis not present

## 2017-09-11 NOTE — ED Notes (Signed)
Pt here for rabies injection. 

## 2017-10-15 IMAGING — CT CT HEAD W/O CM
3 of 4 series · 17 of 47 positions shown, 20 images · non-contrast
Comparison: Brain MRI 07/31/2003

CLINICAL DATA: Unspecified headache

EXAM:
CT HEAD WITHOUT CONTRAST
TECHNIQUE: Contiguous axial images were obtained from the base of the skull
through the vertex without intravenous contrast.

[Series 32: 3d filtered head w/o · axial · non-contrast · 0.49mm/px · z∈[-31,+109]mm · 11 of 34 slices shown, 14 images]
[im 3/34  brain]
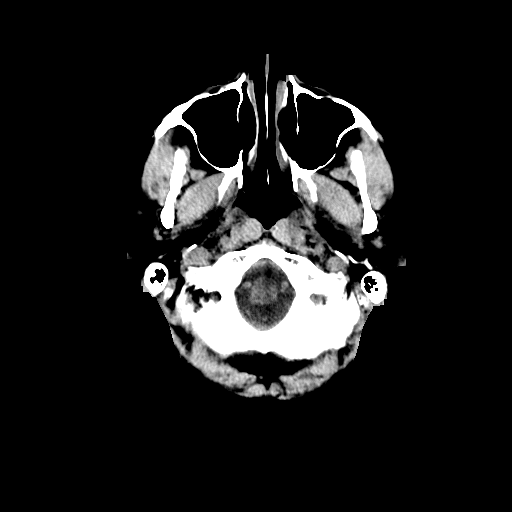
[im 3/34  bone]
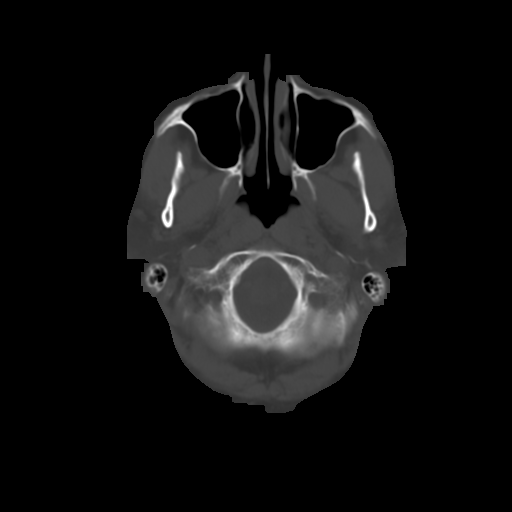
[im 5/34  brain]
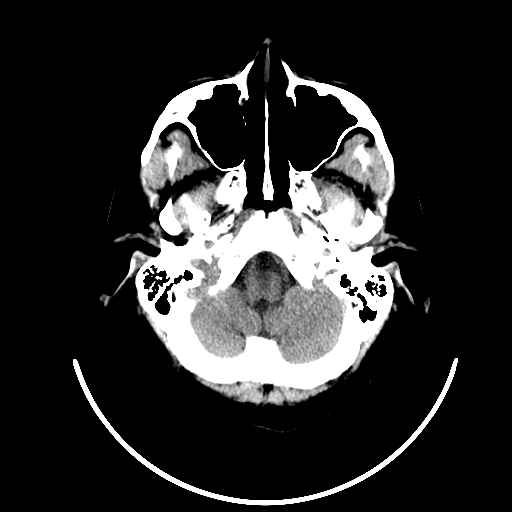
[im 8/34  brain]
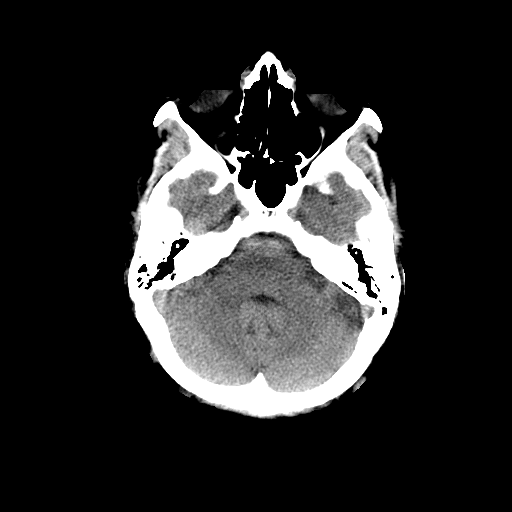
[im 12/34  brain]
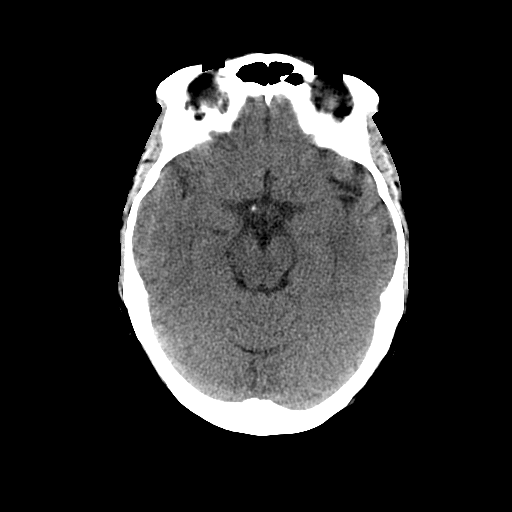
[im 15/34  brain]
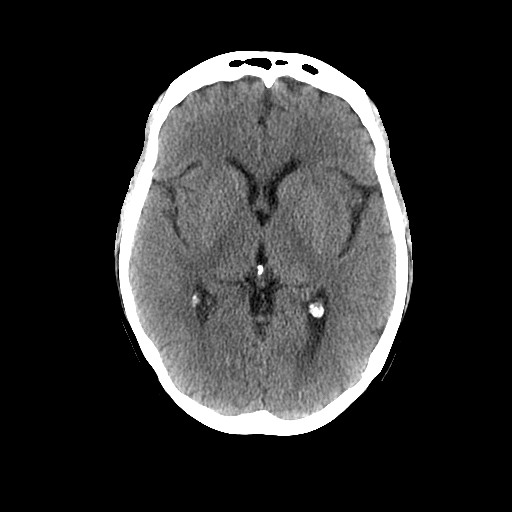
[im 15/34  bone]
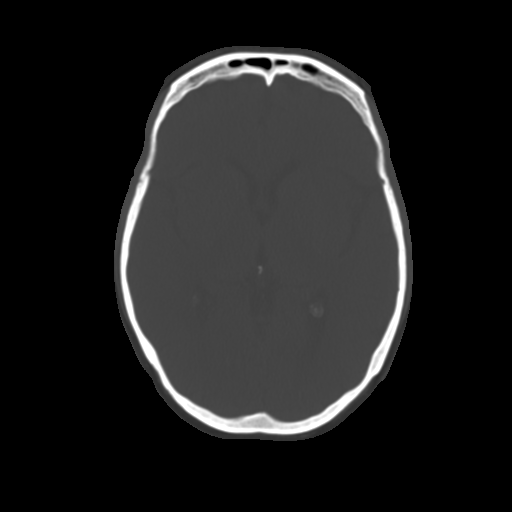
[im 17/34  brain]
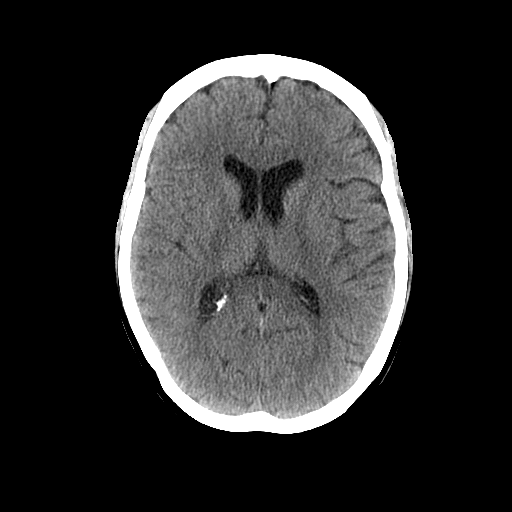
[im 19/34  brain]
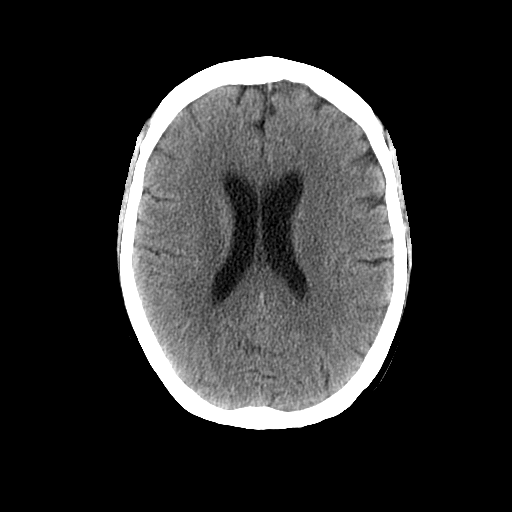
[im 22/34  brain]
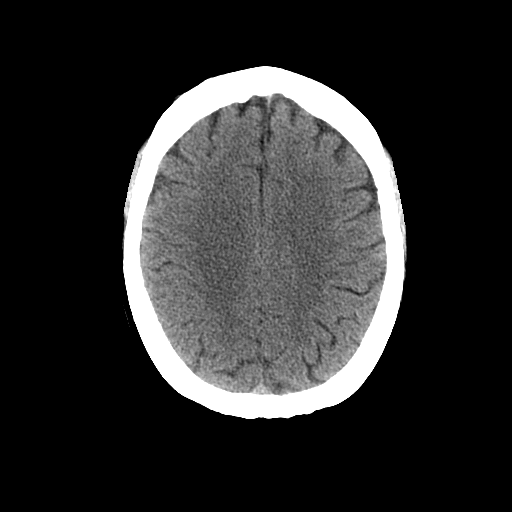
[im 26/34  brain]
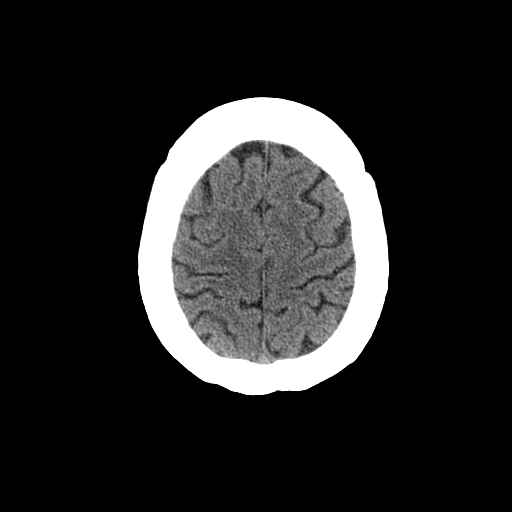
[im 26/34  bone]
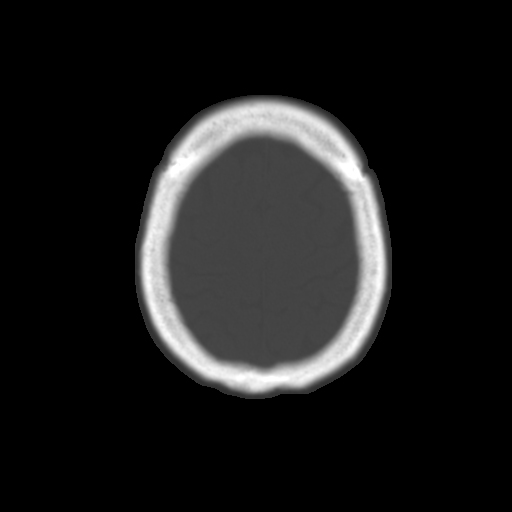
[im 29/34  brain]
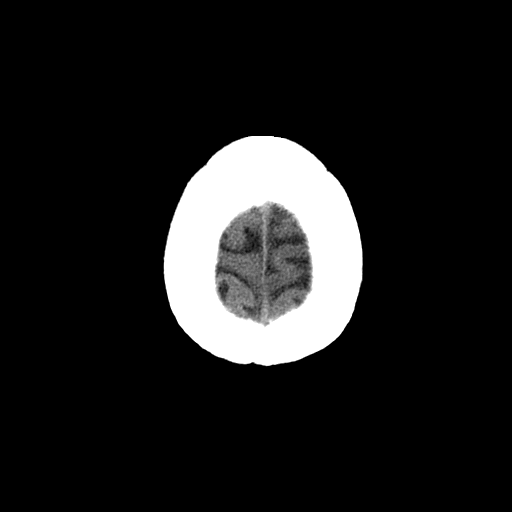
[im 31/34  brain]
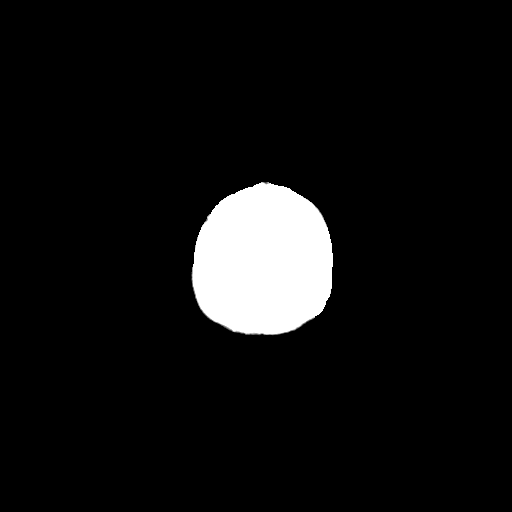

[Series 601: coronal brain · coronal · 0.49mm/px · 3 of 72 slices shown]
[im 24/72  brain]
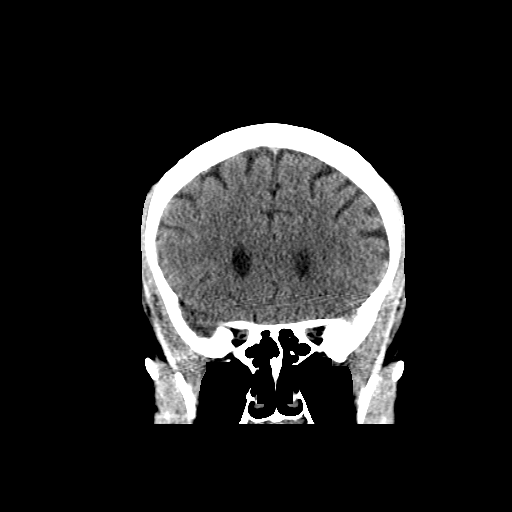
[im 32/72  brain]
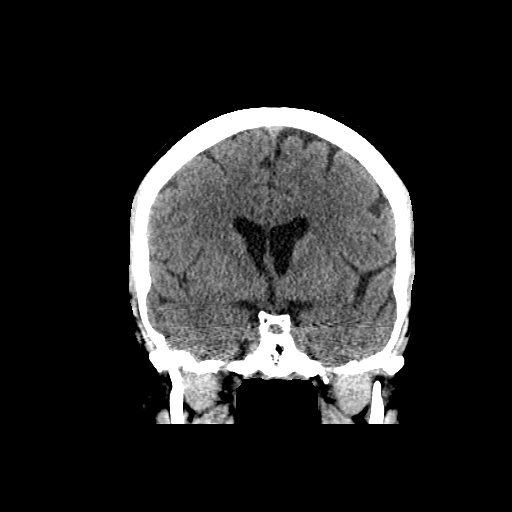
[im 40/72  brain]
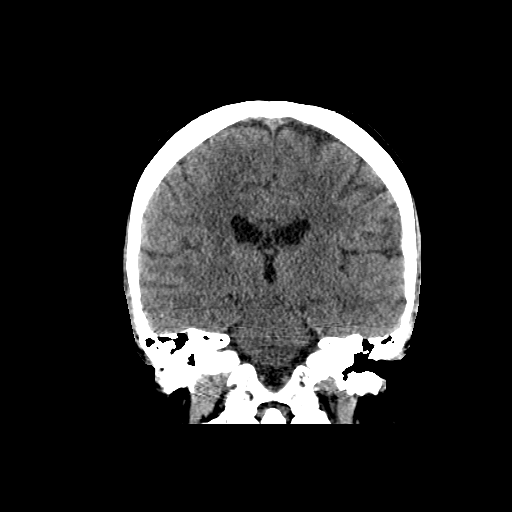

[Series 602: sagittal brain · sagittal · 0.49mm/px · 3 of 62 slices shown]
[im 21/62  brain]
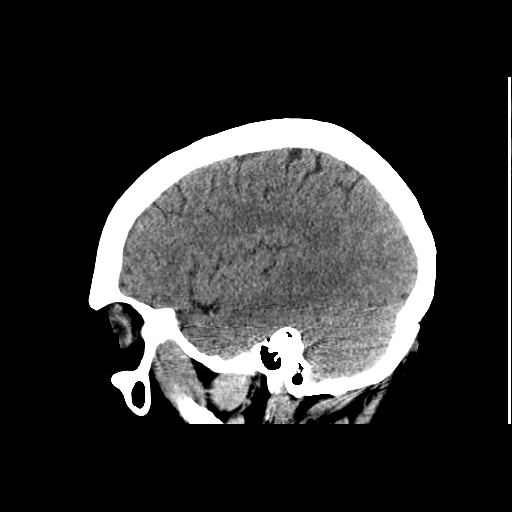
[im 31/62  brain]
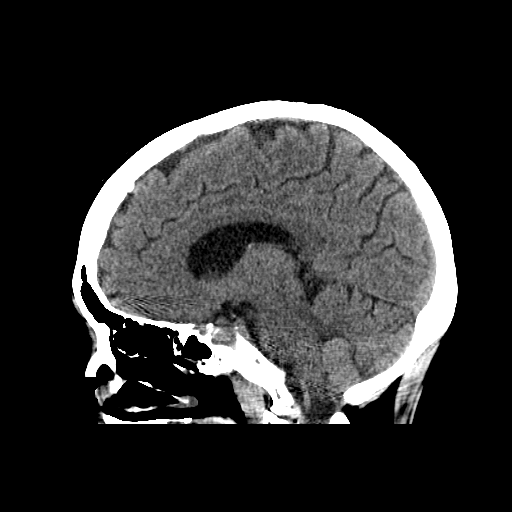
[im 41/62  brain]
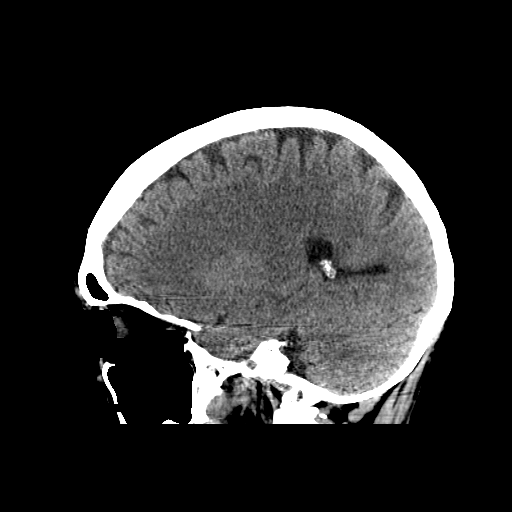

[17 of 47 positions shown; findings below may reference images not displayed]

FINDINGS: Skull and Sinuses:Negative for fracture or destructive process. The
visualized mastoids, middle ears, and imaged paranasal sinuses are
clear.

Visualized orbits: Negative.

Brain: No evidence of acute infarction, hemorrhage, hydrocephalus,
or mass lesion/mass effect.
IMPRESSION: Normal study.

## 2017-10-23 ENCOUNTER — Encounter: Payer: Self-pay | Admitting: Obstetrics & Gynecology

## 2018-01-21 ENCOUNTER — Encounter: Payer: Self-pay | Admitting: Obstetrics & Gynecology

## 2018-01-21 ENCOUNTER — Other Ambulatory Visit (HOSPITAL_COMMUNITY)
Admission: RE | Admit: 2018-01-21 | Discharge: 2018-01-21 | Disposition: A | Discharge status: Home / Self Care | Payer: Medicare Other | Source: Ambulatory Visit | Attending: Obstetrics & Gynecology | Admitting: Obstetrics & Gynecology

## 2018-01-21 ENCOUNTER — Ambulatory Visit (INDEPENDENT_AMBULATORY_CARE_PROVIDER_SITE_OTHER): Payer: Medicare Other | Admitting: Obstetrics & Gynecology

## 2018-01-21 ENCOUNTER — Other Ambulatory Visit: Payer: Self-pay

## 2018-01-21 VITALS — BP 110/76 | HR 92 | Resp 16 | Ht 64.25 in | Wt 195.2 lb

## 2018-01-21 DIAGNOSIS — Z124 Encounter for screening for malignant neoplasm of cervix: Secondary | ICD-10-CM | POA: Diagnosis present

## 2018-01-21 DIAGNOSIS — R1011 Right upper quadrant pain: Secondary | ICD-10-CM

## 2018-01-21 DIAGNOSIS — Z0000 Encounter for general adult medical examination without abnormal findings: Secondary | ICD-10-CM

## 2018-01-21 DIAGNOSIS — Z01419 Encounter for gynecological examination (general) (routine) without abnormal findings: Secondary | ICD-10-CM

## 2018-01-21 DIAGNOSIS — K8020 Calculus of gallbladder without cholecystitis without obstruction: Secondary | ICD-10-CM | POA: Diagnosis not present

## 2018-01-21 DIAGNOSIS — R7309 Other abnormal glucose: Secondary | ICD-10-CM

## 2018-01-21 DIAGNOSIS — R3129 Other microscopic hematuria: Secondary | ICD-10-CM

## 2018-01-21 DIAGNOSIS — K828 Other specified diseases of gallbladder: Secondary | ICD-10-CM

## 2018-01-21 LAB — POCT URINALYSIS DIPSTICK
Bilirubin, UA: NEGATIVE
Glucose, UA: NEGATIVE
Ketones, UA: NEGATIVE
Leukocytes, UA: NEGATIVE
Nitrite, UA: NEGATIVE
Protein, UA: NEGATIVE
Urobilinogen, UA: 0.2 E.U./dL
pH, UA: 5 (ref 5.0–8.0)

## 2018-01-21 MED ORDER — FLUCONAZOLE 150 MG PO TABS: ORAL_TABLET | ORAL | 0 refills | Status: DC

## 2018-01-21 NOTE — Progress Notes (Signed)
62 y.o. G0P0 Single White or Caucasian female here for annual exam.  Having a lot of hot flashes.  Mother is having some changes with her memory.  She's been going two weeks there and two weeks here.  Having a caregiver come in two days a weeks.  This is someone close to family.  She is driving in and around [place] only.    Had possible exposure to bat.  Did have rabies vaccinations after this.  It was found in her house after 7 days.     Denies vaginal bleeding.    Eating Vegan diet.  Having a lot of RUQ pain.  Recent ultrasound showed gallstones and sludge.    Having a rash on her vulva and groin.  Used rubbing alcohol on this and this burned.  Would like my opinion about this.  Recent diagnosis of gallstones and sludge.  Having a lot of issues with RUQ pain with eating.  Would like surgery recommendation.  Referral will be made.  Patient's last menstrual period was 12/31/2008.          Sexually active: No.  The current method of family planning is abstinence and post menopausal status.    Exercising: No.   Smoker:  no  Health Maintenance: Pap:  01/11/17 Neg.   01/02/16 neg. HR HPV:neg  History of abnormal Pap:  no MMG:  09/18/17 BIRADS2:Benign  Colonoscopy:  07/31/17 Normal. F/u 5 years.  Endoscopy was also done.   BMD:   08/11/14 Osteoporosis  TDaP:  2012 Pneumonia vaccine(s):  n/a Shingrix:   Declines having this done currently Hep C testing: 01/02/16 Neg  Screening Labs: PCP  UA: RBC=Trace    reports that she has never smoked. She has never used smokeless tobacco. She reports that she does not drink alcohol or use drugs.  Past Medical History:  Diagnosis Date  . Anxiety   . Basal cell carcinoma of face    forehead  . CFS (chronic fatigue syndrome)   . Chronic insomnia 01/20/2016  . Common migraine with intractable migraine 01/20/2016  . Depression   . Fibromyalgia   . Hematuria    neg eval Dr. Joelyn Oms, 2000  . Migraine   . Renal cyst   . Sciatica    bulging L5-S1  .  Tachycardia    negative stress test    Past Surgical History:  Procedure Laterality Date  . MOHS SURGERY  9/08   Dr Eula Fried    Current Outpatient Medications  Medication Sig Dispense Refill  . B Complex Vitamins (VITAMIN B COMPLEX PO) Take by mouth daily.    . bisoprolol (ZEBETA) 5 MG tablet Take 5 mg by mouth daily.  3  . Brexpiprazole (REXULTI) 0.5 MG TABS Take 0.5 tablets by mouth daily.    Marland Kitchen buPROPion (WELLBUTRIN SR) 100 MG 12 hr tablet     . clonazePAM (KLONOPIN) 1 MG tablet Take 1 mg by mouth at bedtime and may repeat dose one time if needed.    . Coenzyme Q10 (CO Q 10 PO) Take by mouth.    . desvenlafaxine (PRISTIQ) 100 MG 24 hr tablet     . famotidine (PEPCID) 20 MG tablet daily.    . fish oil-omega-3 fatty acids 1000 MG capsule Take 3 g by mouth daily.     Marland Kitchen FLUoxetine (PROZAC) 10 MG capsule Take 1 capsule by mouth daily.  1  . KETAMINE HCL IV Inject into the vein. Every 3 weeks  Dr. Lorri Frederick    .  methylphenidate (RITALIN) 5 MG tablet Take 2 tablets by mouth 4 (four) times daily as needed.  0  . MILK THISTLE PO Take by mouth.    . NON FORMULARY Green drink with greens    . Probiotic Product (PROBIOTIC DAILY) CAPS Take by mouth daily.    . promethazine (PHENERGAN) 25 MG tablet as needed.     . rizatriptan (MAXALT) 10 MG tablet as needed.    . traZODone (DESYREL) 50 MG tablet Take 50 mg by mouth at bedtime.    . TURMERIC PO Take by mouth daily.     No current facility-administered medications for this visit.     Family History  Problem Relation Age of Onset  . Colon cancer Father 29          . Depression Father   . Breast cancer Mother 62       her 2 neu+  . Hypertension Mother   . Heart Problems Mother        pacemaker placed  . Heart disease Maternal Grandmother   . Heart disease Paternal Grandmother   . Breast cancer Maternal Aunt        maternal great aunt    Review of Systems  HENT: Positive for congestion.   Genitourinary: Positive for hematuria.        Vulvar itching  Loss of urine spontaneously  Loss of urine with sneeze or cough   Musculoskeletal:       Muscle weakness   Neurological: Positive for headaches.  Psychiatric/Behavioral: The patient is nervous/anxious.        Depression   All other systems reviewed and are negative.   Exam:   BP 110/76 (BP Location: Right Arm, Patient Position: Sitting, Cuff Size: Large)   Pulse 92   Resp 16   Ht 5' 4.25" (1.632 m)   Wt 195 lb 3.2 oz (88.5 kg)   LMP 12/31/2008   BMI 33.25 kg/m    Height: 5' 4.25" (163.2 cm)  Ht Readings from Last 3 Encounters:  01/21/18 5' 4.25" (1.632 m)  08/31/17 5\' 4"  (1.626 m)  08/28/17 5\' 4"  (1.626 m)    General appearance: alert, cooperative and appears stated age Head: Normocephalic, without obvious abnormality, atraumatic Neck: no adenopathy, supple, symmetrical, trachea midline and thyroid normal to inspection and palpation Lungs: clear to auscultation bilaterally Breasts: normal appearance, no masses or tenderness Heart: regular rate and rhythm Abdomen: soft, non-tender; bowel sounds normal; no masses,  no organomegaly Extremities: extremities normal, atraumatic, no cyanosis or edema Skin: Skin color, texture, turgor normal. No rashes or lesions Lymph nodes: Cervical, supraclavicular, and axillary nodes normal. No abnormal inguinal nodes palpated Neurologic: Grossly normal   Pelvic: External genitalia:  Groin erythematous rash with satellite lesions              Urethra:  normal appearing urethra with no masses, tenderness or lesions              Bartholins and Skenes: normal                 Vagina: normal appearing vagina with normal color and discharge, no lesions              Cervix: no lesions              Pap taken: Yes.   Bimanual Exam:  Uterus:  normal size, contour, position, consistency, mobility, non-tender              Adnexa: normal  adnexa and no mass, fullness, tenderness               Rectovaginal: Confirms                Anus:  normal sphincter tone, no lesions  Chaperone was present for exam.  A:  Well Woman with normal exam PMP, no HRT Groin rash c/w candida H/O depression.  Has been on multiple medication regimens and did have ECT in the past Family hx of breast cancer in her mother Dense breat tissue Chronic fatigue/fibromyalgia  Trace blood on dip u/a Gallstones and sludge with RUQ pain  P:   Mammogram guidelines reviewed.  Doing yearly 3D MMG.  Pt typically returns for 6 month breast recheck as well. pap smear obtained today.  She desires yearly pap smear Diflucan 150mg  po x 1, repeat 72 hours.  Advised to call if not fully resolved. HbA1C obtained today Urine micro obtained today Referral to general surgery Return annually or prn

## 2018-01-22 LAB — URINALYSIS, MICROSCOPIC ONLY
Bacteria, UA: NONE SEEN
Casts: NONE SEEN /lpf
Epithelial Cells (non renal): NONE SEEN /hpf (ref 0–10)

## 2018-01-22 LAB — HEMOGLOBIN A1C
Est. average glucose Bld gHb Est-mCnc: 108 mg/dL
Hgb A1c MFr Bld: 5.4 % (ref 4.8–5.6)

## 2018-01-24 ENCOUNTER — Encounter: Payer: Self-pay | Admitting: Obstetrics & Gynecology

## 2018-01-24 ENCOUNTER — Telehealth: Payer: Self-pay | Admitting: Obstetrics & Gynecology

## 2018-01-24 LAB — CYTOLOGY - PAP: Diagnosis: NEGATIVE

## 2018-01-24 NOTE — Telephone Encounter (Signed)
Patient sent the following message through [place]. Routing to triage to assist patient with request.   Thankful for the phone call today and how through and the empathy you show... I am sure for all your patients! I thought I would remember, but didn't. I forgot the primary care dr name you use. I know I tried to get in with her after Dr. Minna Antis left but thought I might give it another try. Thank you. Hope you have a wonderful weekend. Allison Cruz.

## 2018-01-30 ENCOUNTER — Encounter: Payer: Self-pay | Admitting: Obstetrics & Gynecology

## 2018-01-30 ENCOUNTER — Telehealth: Payer: Self-pay | Admitting: Obstetrics & Gynecology

## 2018-01-30 NOTE — Telephone Encounter (Signed)
I advised OTC Lamisil for skin yeast.  If improved, I think ok to use for another week before being seen again.  Thanks.

## 2018-01-30 NOTE — Telephone Encounter (Signed)
Patient sent the following message through [place]. Routing to triage to assist patient with request.   Dr. Sabra Heck. I took the medication you gave me for yeast infection. Took the second pill Sunday afternoon. I have been using the cream you recommended and it has helped itching. It is better but not gone. Uncomfortable. Wondered what you would like me to do for it now. Sorry to bother you but felt I needed to ask. Thank you. Allison Cruz

## 2018-01-30 NOTE — Telephone Encounter (Signed)
Routing message to Dr. Sabra Heck.  Pt seen 10/22,  Diflucan 150 mg po x 2 for groin rash and lesions. Advised to contact back if not improved.  Pt sent mychart message.  Needs follow up office visit?

## 2018-01-31 NOTE — Telephone Encounter (Signed)
Call to patient and she is given message from Dr. Sabra Heck.  She will try OTC treatment and call back for an office visit if not improved.  Encounter closed.

## 2018-05-16 ENCOUNTER — Ambulatory Visit: Payer: Self-pay | Admitting: Surgery

## 2018-05-16 NOTE — H&P (Addendum)
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  She has been experiencing a dull achy pain in the epigastrium and right subcostal region since approximately August 2019.  She notices it most when she is going to bed at night, and it has been aggravated after eating but not any particular type of food.  In fact she has been trying to do a vegan diet but really couldn't sustain it as it requires a lot of spices and this gives her significant indigestion. She has had one episode of severe sharp pain wrapping around both subcostal margins but for the most part she is just aware that there is a discomfort in the right upper quadrant and upper epigastrium.  She had an ultrasound done at [facility] in October 2019 that shows gallstones without any signs of cholecystitis, common bile duct 3 mm, liver appeared normal.  LFTs at this time were also normal. She has never had any abdominal surgery.  Allergies  Allergen Reactions  . Dramamine Ii [Meclizine Hcl]   . Erythromycin   . Sulfa Antibiotics   . Dimenhydrinate Rash    Past Medical History:  Diagnosis Date  . Anxiety   . Basal cell carcinoma of face    forehead  . CFS (chronic fatigue syndrome)   . Chronic insomnia 01/20/2016  . Common migraine with intractable migraine 01/20/2016  . Depression   . Fibromyalgia   . Hematuria    neg eval Dr. Joelyn Oms, 2000  . Migraine   . Renal cyst   . Sciatica    bulging L5-S1  . Tachycardia    negative stress test    Past Surgical History:  Procedure Laterality Date  . MOHS SURGERY  9/08   Dr Eula Fried    Family History  Problem Relation Age of Onset  . Colon cancer Father 37           . Depression Father   . Breast cancer Mother 71       her 2 neu+  . Hypertension Mother   . Heart Problems Mother        pacemaker placed  . Heart disease Maternal Grandmother   . Heart disease Paternal Grandmother   . Breast cancer Maternal Aunt        maternal great aunt    Social History   Socioeconomic History  . Marital status: Single    Spouse name: Not on file  . Number of children: 0  . Years of education: M.E.D.  . Highest education level: Not on file  Occupational History  . Occupation: Retired  Scientific laboratory technician  . Financial resource strain: Not on file  . Food insecurity:    Worry: Not on file    Inability: Not on file  . Transportation needs:    Medical: Not on file    Non-medical: Not on file  Tobacco Use  . Smoking status: Never Smoker  . Smokeless tobacco: Never Used  Substance and Sexual Activity  . Alcohol use: No  . Drug use: No  . Sexual activity: Not Currently  Lifestyle  . Physical activity:    Days per week: Not on file    Minutes per session: Not on file  .  Stress: Not on file  Relationships  . Social connections:    Talks on phone: Not on file    Gets together: Not on file    Attends religious service: Not on file    Active member of club or organization: Not on file    Attends meetings of clubs or organizations: Not on file    Relationship status: Not on file  Other Topics Concern  . Not on file  Social History Narrative   Lives at home w/ her dog   Right-handed   Caffeine: 1-2 cups in the morning    Current Outpatient Medications on File Prior to Visit  Medication Sig Dispense Refill  . B Complex Vitamins (VITAMIN B COMPLEX PO) Take by mouth daily.    . bisoprolol (ZEBETA) 5 MG tablet Take 5 mg by mouth daily.  3  . Brexpiprazole (REXULTI) 0.5 MG TABS Take 0.5 tablets by mouth daily.    Marland Kitchen buPROPion (WELLBUTRIN SR) 100 MG 12 hr tablet     . clonazePAM (KLONOPIN) 1 MG tablet Take 1 mg by mouth at bedtime and may repeat  dose one time if needed.    . Coenzyme Q10 (CO Q 10 PO) Take by mouth.    . desvenlafaxine (PRISTIQ) 100 MG 24 hr tablet     . famotidine (PEPCID) 20 MG tablet daily.    . fish oil-omega-3 fatty acids 1000 MG capsule Take 3 g by mouth daily.     . fluconazole (DIFLUCAN) 150 MG tablet Take 1 tab po and then repeat in 72 hours 2 tablet 0  . FLUoxetine (PROZAC) 10 MG capsule Take 1 capsule by mouth daily.  1  . KETAMINE HCL IV Inject into the vein. Every 3 weeks  Dr. Lorri Frederick    . methylphenidate (RITALIN) 5 MG tablet Take 2 tablets by mouth 4 (four) times daily as needed.  0  . MILK THISTLE PO Take by mouth.    . NON FORMULARY Green drink with greens    . Probiotic Product (PROBIOTIC DAILY) CAPS Take by mouth daily.    . promethazine (PHENERGAN) 25 MG tablet as needed.     . rizatriptan (MAXALT) 10 MG tablet as needed.    . traZODone (DESYREL) 50 MG tablet Take 50 mg by mouth at bedtime.    . TURMERIC PO Take by mouth daily.     No current facility-administered medications on file prior to visit.     Review of Systems General Present- Fatigue and Night Sweats. Not Present- Appetite Loss, Chills, Fever, Weight Gain and Weight Loss. Skin Not Present- Change in Wart/Mole, Dryness, Hives, Jaundice, New Lesions, Non-Healing Wounds, Rash and Ulcer. HEENT Present- Hoarseness, Oral Ulcers, Seasonal Allergies, Sinus Pain and Wears glasses/contact lenses. Not Present- Earache, Hearing Loss, Nose Bleed, Ringing in the Ears, Sore Throat, Visual Disturbances and Yellow Eyes. Respiratory Not Present- Bloody sputum, Chronic Cough, Difficulty Breathing, Snoring and Wheezing. Cardiovascular Present- Palpitations and Rapid Heart Rate. Not Present- Chest Pain, Difficulty Breathing Lying Down, Leg Cramps, Shortness of Breath and Swelling of Extremities. Gastrointestinal Present- Excessive gas. Not Present- Abdominal Pain, Bloating, Bloody Stool, Change in Bowel Habits, Chronic diarrhea, Constipation,  Difficulty Swallowing, Gets full quickly at meals, Hemorrhoids, Indigestion, Nausea, Rectal Pain and Vomiting. Female Genitourinary Present- Pelvic Pain. Not Present- Frequency, Nocturia, Painful Urination and Urgency. Musculoskeletal Present- Back Pain and Joint Stiffness. Not Present- Joint Pain, Muscle Pain, Muscle Weakness and Swelling of Extremities. Neurological Present- Headaches. Not Present- Decreased Memory, Fainting, Numbness, Seizures,  Tingling, Tremor, Trouble walking and Weakness. Psychiatric Present- Depression and Fearful. Not Present- Anxiety, Bipolar, Change in Sleep Pattern and Frequent crying. Endocrine Present- Heat Intolerance. Not Present- Cold Intolerance, Excessive Hunger, Hair Changes, Hot flashes and New Diabetes.  Physical Exam: Weight: 189.8 lb Height: 64in Body Surface Area: 1.91 m Body Mass Index: 32.58 kg/m  Temp.: 97.46F(Temporal)  Pulse: 95 (Regular)  BP: 122/78 (Sitting, Left Arm, Standard)  Gen: alert and cooperative Eye: extraocular motion intact, no scleral icterus ENT: moist mucus membranes, dentition intact Neck: no mass or thyromegaly Chest: unlabored respirations, symmetrical air entry, clear bilaterally CV: regular rate and rhythm, no pedal edema Abdomen: Centripetal obesity, soft, nontender, nondistended. No mass or organomegaly MSK: strength symmetrical throughout, no deformity Neuro: grossly intact, normal gait Psych: normal mood and affect, appropriate insight Skin: warm and dry, no rash or lesion on limited exam   No flowsheet data found.  CMP Latest Ref Rng & Units 10/08/2012 10/01/2012  Glucose 70 - 99 mg/dL 91 59(L)  BUN 6 - 23 mg/dL 17 22  Creatinine 0.50 - 1.10 mg/dL 1.08 1.42(H)  Sodium 135 - 145 mEq/L 135 137  Potassium 3.5 - 5.3 mEq/L 4.5 4.4  Chloride 96 - 112 mEq/L 100 99  CO2 19 - 32 mEq/L 26 25  Calcium 8.4 - 10.5 mg/dL 9.2 9.8  Total Protein 6.0 - 8.3 g/dL - 7.8  Total Bilirubin 0.3 - 1.2 mg/dL - 0.4   Alkaline Phos 39 - 117 U/L - 58  AST 0 - 37 U/L - 23  ALT 0 - 35 U/L - 15    No results found for: INR, PROTIME  Imaging: No results found.    A/P: Biliary colic. I recommend proceeding with laparoscopic cholecystectomy. Discussed the technique and risks of surgery including bleeding, pain, scarring, intraabdominal injury specifically to the common bile duct and sequelae, conversion to open surgery, blood clot, pneumonia, heart attack, stroke, failure to resolve symptoms, etc. Also discussed alternative of low-fat diet and ongoing observation with risk of worsening symptoms, development of cholecystitis, cholangitis, pancreatitis, and signs/symptoms that should prompt her to seek urgent evaluation. Questions welcomed and answered. She would like to proceed with surgery.  Given her history of tachycardia, she will need cardiac clearance.    Romana Juniper, MD [facility] Surgery, [place] Pager [phone]

## 2018-05-19 ENCOUNTER — Telehealth: Payer: Self-pay | Admitting: *Deleted

## 2018-05-19 NOTE — Telephone Encounter (Signed)
   Primary Cardiologist:No primary care provider on file.  Chart reviewed as part of pre-operative protocol coverage. Allison Cruz has never been seen by [facility]. She will need new provider appointment with appropriate referral incase need prior auth.   Please send to appropriate department with referral.    [name], PA  05/19/2018, 10:56 AM

## 2018-05-19 NOTE — Telephone Encounter (Signed)
   [facility] Pre-operative Risk Assessment    Request for surgical clearance:  1. What type of surgery is being performed? LAPARASCOPIC  CHOLECYSTECTOMY   2. When is this surgery scheduled? TBD   3. What type of clearance is required (medical clearance vs. Pharmacy clearance to hold med vs. Both)? MEDICAL  4. Are there any medications that need to be held prior to surgery and how long?NONE LISTED    5. Practice name and name of physician performing surgery? [facility]; DR. Vikki Ports [place]   6. What is your office phone number [phone]    7.   What is your office fax number [phone]  8.   Anesthesia type (None, local, MAC, general) ? GENERAL   Allison Cruz 05/19/2018, 10:45 AM  _________________________________________________________________   (provider comments below)

## 2018-05-23 ENCOUNTER — Encounter: Payer: Self-pay | Admitting: Cardiology

## 2018-06-11 ENCOUNTER — Ambulatory Visit: Payer: Medicare Other | Admitting: Cardiology

## 2018-07-25 ENCOUNTER — Ambulatory Visit: Payer: BC Managed Care – PPO | Admitting: Obstetrics & Gynecology

## 2018-08-29 ENCOUNTER — Ambulatory Visit: Payer: BC Managed Care – PPO | Admitting: Obstetrics & Gynecology

## 2018-10-02 ENCOUNTER — Other Ambulatory Visit: Payer: Self-pay

## 2018-10-06 ENCOUNTER — Encounter: Payer: Self-pay | Admitting: Obstetrics & Gynecology

## 2018-10-06 NOTE — Telephone Encounter (Signed)
Error

## 2018-10-07 ENCOUNTER — Telehealth: Payer: Self-pay | Admitting: Obstetrics & Gynecology

## 2018-10-07 ENCOUNTER — Ambulatory Visit: Payer: BC Managed Care – PPO | Admitting: Obstetrics & Gynecology

## 2018-10-07 NOTE — Telephone Encounter (Signed)
Appointment for 10-07-18 has been canceled.

## 2018-10-07 NOTE — Telephone Encounter (Signed)
Call to patient. Six month breast recheck rescheduled to Thursday 10-16-2018 at 1300. Advised patient could discuss diagnostic MMG with Dr. Sabra Heck at that appointment. Patient agreeable and appreciative of phone call.   Routing to provider and will close encounter.

## 2018-10-07 NOTE — Telephone Encounter (Signed)
Patient sent the following correspondence through [place]. Routing to triage to assist patient with request.  Dr. Sabra Heck... I have an appointment tomorrow for six month breast exam. I had to reschedule my appointment from last month because I have been with my mom for several months. She had fallen twice and trying to get her a bit stronger before coming back. She is not feeling very well today and I can't leave her like this. It has been so hard.   So I will need to cancel appointment and will be glad to pay cancellation. I will call tomorrow as I believe you are closed for fourth. It is time for my mammogram anyway. Could you order a diagnostic mammogram for me? You usually do since breast so dense and my anxiety is so high about this. Or do you want me to reschedule first?   Thank you so much, Allison Cruz to you for consideration of A Special Blend coming. It is just so weird right now. I hope you and your family are well and happy!

## 2018-10-09 ENCOUNTER — Ambulatory Visit: Payer: Medicare Other | Admitting: Cardiology

## 2018-10-14 ENCOUNTER — Telehealth: Payer: Self-pay | Admitting: *Deleted

## 2018-10-14 ENCOUNTER — Other Ambulatory Visit: Payer: Self-pay

## 2018-10-14 NOTE — Telephone Encounter (Signed)
Left voicemail to call back re: need to move appt scheduled for 10/16/18 @1 :00pm.  We can move appt to 11:30am on the same day. Ok per Dr. Sabra Heck.

## 2018-10-15 NOTE — Telephone Encounter (Signed)
Patient returned call. Appointment moved to 11:30 as requested.

## 2018-10-16 ENCOUNTER — Ambulatory Visit (INDEPENDENT_AMBULATORY_CARE_PROVIDER_SITE_OTHER): Payer: Medicare Other | Admitting: Obstetrics & Gynecology

## 2018-10-16 ENCOUNTER — Other Ambulatory Visit: Payer: Self-pay

## 2018-10-16 ENCOUNTER — Ambulatory Visit: Payer: BC Managed Care – PPO | Admitting: Obstetrics & Gynecology

## 2018-10-16 ENCOUNTER — Encounter: Payer: Self-pay | Admitting: Obstetrics & Gynecology

## 2018-10-16 VITALS — BP 102/68 | HR 80 | Temp 97.3°F | Resp 12 | Ht 64.5 in | Wt 189.0 lb

## 2018-10-16 DIAGNOSIS — Z803 Family history of malignant neoplasm of breast: Secondary | ICD-10-CM

## 2018-10-16 DIAGNOSIS — N644 Mastodynia: Secondary | ICD-10-CM | POA: Diagnosis not present

## 2018-10-16 NOTE — Progress Notes (Signed)
GYNECOLOGY  VISIT  CC:   Right Breast pain  HPI: 63 y.o. G0P0 Single White or Caucasian female here for 6 month breast check. Patient states that her right breast "hurts more than normal" and left breast is "more tender".  Last MMG was 08/2017.    Having lots of stressors with her mother who is 47 and aging.  Has caregivers come in to help with her mother's care.      GYNECOLOGIC HISTORY: Patient's last menstrual period was 12/31/2008. Contraception: Postmenopausal Menopausal hormone therapy: none  Patient Active Problem List   Diagnosis Date Noted  . Chronic fatigue 08/01/2017  . Consumes a vegan diet 08/01/2017  . Diverticulosis 08/01/2017  . GERD (gastroesophageal reflux disease) 08/01/2017  . Common migraine with intractable migraine 01/20/2016  . Chronic insomnia 01/20/2016  . Fibromyalgia 10/12/2015  . Generalized anxiety disorder 08/31/2015  . Osteoporosis 10/05/2014  . Dense breasts 04/15/2014  . Family history of breast cancer in first degree relative 04/15/2014  . MDD (major depressive disorder), recurrent episode, severe ([place]) 04/08/2013    Past Medical History:  Diagnosis Date  . Anxiety   . Basal cell carcinoma of face    forehead  . CFS (chronic fatigue syndrome)   . Chronic insomnia 01/20/2016  . Common migraine with intractable migraine 01/20/2016  . Depression   . Fibromyalgia   . Hematuria    neg eval Dr. Joelyn Oms, 2000  . Migraine   . Renal cyst   . Sciatica    bulging L5-S1  . Tachycardia    negative stress test    Past Surgical History:  Procedure Laterality Date  . MOHS SURGERY  9/08   Dr Eula Fried    MEDS:   Current Outpatient Medications on File Prior to Visit  Medication Sig Dispense Refill  . B Complex Vitamins (VITAMIN B COMPLEX PO) Take by mouth daily.    . bisoprolol (ZEBETA) 5 MG tablet Take 2.5 mg by mouth daily.   3  . Brexpiprazole (REXULTI) 0.5 MG TABS Take 0.25 tablets by mouth daily.     Marland Kitchen buPROPion (WELLBUTRIN SR) 100 MG 12  hr tablet     . clonazePAM (KLONOPIN) 1 MG tablet Take 0.5 mg by mouth at bedtime and may repeat dose one time if needed. Take 0.5mg  in the morning and evening and 1mg  at bedtime    . Coenzyme Q10 (CO Q 10 PO) Take by mouth.    . desvenlafaxine (PRISTIQ) 100 MG 24 hr tablet     . famotidine (PEPCID) 20 MG tablet daily.    . fish oil-omega-3 fatty acids 1000 MG capsule Take 3 g by mouth daily.     Marland Kitchen FLUoxetine (PROZAC) 20 MG capsule Take 1 capsule by mouth daily.   1  . KETAMINE HCL IV Inject into the vein. Every 3 weeks  Dr. Lorri Frederick    . methylphenidate (RITALIN) 10 MG tablet Take 10 mg by mouth 4 (four) times daily as needed. Take 1 1/2 tablet in the morning  0  . MILK THISTLE PO Take by mouth.    . NON FORMULARY Green drink with greens    . Probiotic Product (PROBIOTIC DAILY) CAPS Take by mouth daily.    . promethazine (PHENERGAN) 25 MG tablet as needed.     . rizatriptan (MAXALT) 10 MG tablet as needed.    . traZODone (DESYREL) 50 MG tablet Take 50 mg by mouth at bedtime.    . TURMERIC PO Take by mouth daily.  No current facility-administered medications on file prior to visit.     ALLERGIES: Dramamine ii [meclizine hcl], Erythromycin, Sulfa antibiotics, and Dimenhydrinate  Family History  Problem Relation Age of Onset  . Colon cancer Father 44          . Depression Father   . Breast cancer Mother 17       her 2 neu+  . Hypertension Mother   . Heart Problems Mother        pacemaker placed  . Heart disease Maternal Grandmother   . Heart disease Paternal Grandmother   . Breast cancer Maternal Aunt        maternal great aunt    SH:  Single,   Review of Systems  Constitutional:       Right breast pain  HENT: Negative.   Eyes: Negative.   Respiratory: Negative.   Cardiovascular: Negative.   Gastrointestinal: Negative.   Endocrine: Negative.   Genitourinary: Negative.   Musculoskeletal: Negative.   Skin: Negative.   Allergic/Immunologic: Negative.    Neurological: Negative.   Hematological: Negative.   Psychiatric/Behavioral: Negative.     PHYSICAL EXAMINATION:    BP 102/68 (BP Location: Right Arm, Patient Position: Sitting, Cuff Size: Large)   Pulse 80   Temp (!) 97.3 F (36.3 C) (Temporal)   Resp 12   Ht 5' 4.5" (1.638 m)   Wt 189 lb (85.7 kg)   LMP 12/31/2008   BMI 31.94 kg/m     General appearance: alert, cooperative and appears stated age Neck: no adenopathy, supple, symmetrical, trachea midline and thyroid normal to inspection and palpation CV:  Regular rate and rhythm Lungs:  clear to auscultation, no wheezes, rales or rhonchi, symmetric air entry Breasts: normal appearance, no masses or tenderness  Chaperone was present for exam.  Assessment: Family history of breast cancer Tyrer Cusick model calculation for breast cancer is 19.2% Breast pain  Plan: Plan screening MRI.  Due to intermediate breast cancer risks, will then plan limited breast MRI.

## 2018-11-11 ENCOUNTER — Encounter: Payer: Self-pay | Admitting: Obstetrics & Gynecology

## 2018-12-01 ENCOUNTER — Ambulatory Visit: Payer: Medicare Other | Admitting: Cardiology

## 2019-01-12 NOTE — Progress Notes (Deleted)
Cardiology Office Note    Date:  01/12/2019   ID:  Allison Cruz, DOB October 18, 1955, MRN [id number]  PCP:  Cathlean Sauer, MD  Cardiologist:  Fransico Him, MD   No chief complaint on file.   History of Present Illness:  Allison Cruz is a 63 y.o. female who is being seen today for the evaluation of pre-operative cardiac clearance at the request of Clovis Riley, MD.  This is a 63yo female with a hx of GERD and tachycardia in the past who is referred for cardiac evaluation for surgery.    Past Medical History:  Diagnosis Date  . Anxiety   . Basal cell carcinoma of face    forehead  . CFS (chronic fatigue syndrome)   . Chronic fatigue 08/01/2017  . Chronic insomnia 01/20/2016  . Common migraine with intractable migraine 01/20/2016  . Dense breasts 04/15/2014  . Depression   . Diverticulosis 08/01/2017  . Family history of breast cancer in first degree relative 04/15/2014  . Fibromyalgia   . Generalized anxiety disorder 08/31/2015  . GERD (gastroesophageal reflux disease) 08/01/2017  . Hematuria    neg eval Dr. Joelyn Oms, 2000  . MDD (major depressive disorder), recurrent episode, severe ([place]) 04/08/2013  . Migraine   . Osteoporosis 10/05/2014  . Renal cyst   . Sciatica    bulging L5-S1  . Tachycardia    negative stress test    Past Surgical History:  Procedure Laterality Date  . MOHS SURGERY  9/08   Dr Eula Fried    Current Medications: No outpatient medications have been marked as taking for the 01/13/19 encounter (Appointment) with Sueanne Margarita, MD.    Allergies:   Dramamine ii [meclizine hcl], Erythromycin, Sulfa antibiotics, and Dimenhydrinate   Social History   Socioeconomic History  . Marital status: Single    Spouse name: Not on file  . Number of children: 0  . Years of education: M.E.D.  . Highest education level: Not on file  Occupational History  . Occupation: Retired  Scientific laboratory technician  . Financial resource strain: Not on file  . Food insecurity   Worry: Not on file    Inability: Not on file  . Transportation needs    Medical: Not on file    Non-medical: Not on file  Tobacco Use  . Smoking status: Never Smoker  . Smokeless tobacco: Never Used  Substance and Sexual Activity  . Alcohol use: No  . Drug use: No  . Sexual activity: Not Currently  Lifestyle  . Physical activity    Days per week: Not on file    Minutes per session: Not on file  . Stress: Not on file  Relationships  . Social Herbalist on phone: Not on file    Gets together: Not on file    Attends religious service: Not on file    Active member of club or organization: Not on file    Attends meetings of clubs or organizations: Not on file    Relationship status: Not on file  Other Topics Concern  . Not on file  Social History Narrative   Lives at home w/ her dog   Right-handed   Caffeine: 1-2 cups in the morning     Family History:  The patient's ***family history includes Breast cancer in her maternal aunt; Breast cancer (age of onset: 77) in her mother; Colon cancer (age of onset: 76) in her father; Depression in her father; Heart Problems in her  mother; Heart disease in her maternal grandmother and paternal grandmother; Hypertension in her mother.   ROS:   Please see the history of present illness.    ROS All other systems reviewed and are negative.  No flowsheet data found.     PHYSICAL EXAM:   VS:  LMP 12/31/2008    GEN: Well nourished, well developed, in no acute distress  HEENT: normal  Neck: no JVD, carotid bruits, or masses Cardiac: ***RRR; no murmurs, rubs, or gallops,no edema.  Intact distal pulses bilaterally.  Respiratory:  clear to auscultation bilaterally, normal work of breathing GI: soft, nontender, nondistended, + BS MS: no deformity or atrophy  Skin: warm and dry, no rash Neuro:  Alert and Oriented x 3, Strength and sensation are intact Psych: euthymic mood, full affect  Wt Readings from Last 3 Encounters:   10/16/18 189 lb (85.7 kg)  01/21/18 195 lb 3.2 oz (88.5 kg)  08/31/17 201 lb (91.2 kg)      Studies/Labs Reviewed:   EKG:  EKG is*** ordered today.  The ekg ordered today demonstrates ***  Recent Labs: No results found for requested labs within last 8760 hours.   Lipid Panel    Component Value Date/Time   CHOL 249 (H) 10/01/2012 1005   TRIG 86 10/01/2012 1005   HDL 79 10/01/2012 1005   CHOLHDL 3.2 10/01/2012 1005   VLDL 17 10/01/2012 1005   LDLCALC 153 (H) 10/01/2012 1005    Additional studies/ records that were reviewed today include:  ***    ASSESSMENT:    No diagnosis found.   PLAN:  In order of problems listed above:  1. ***    Medication Adjustments/Labs and Tests Ordered: Current medicines are reviewed at length with the patient today.  Concerns regarding medicines are outlined above.  Medication changes, Labs and Tests ordered today are listed in the Patient Instructions below.  There are no Patient Instructions on file for this visit.   Signed, Fransico Him, MD  01/12/2019 4:15 PM    [facility] [address], [place], [place]  [address] Phone: [phone]; Fax: [phone]

## 2019-01-13 ENCOUNTER — Ambulatory Visit: Payer: Medicare Other | Admitting: Cardiology

## 2019-02-03 ENCOUNTER — Encounter: Payer: Self-pay | Admitting: General Practice

## 2019-05-06 DIAGNOSIS — N289 Disorder of kidney and ureter, unspecified: Secondary | ICD-10-CM | POA: Insufficient documentation

## 2019-05-28 ENCOUNTER — Other Ambulatory Visit: Payer: Self-pay

## 2019-05-29 ENCOUNTER — Encounter: Payer: Self-pay | Admitting: Obstetrics & Gynecology

## 2019-05-29 ENCOUNTER — Ambulatory Visit (INDEPENDENT_AMBULATORY_CARE_PROVIDER_SITE_OTHER): Payer: Medicare Other | Admitting: Obstetrics & Gynecology

## 2019-05-29 ENCOUNTER — Other Ambulatory Visit (HOSPITAL_COMMUNITY)
Admission: RE | Admit: 2019-05-29 | Discharge: 2019-05-29 | Disposition: A | Discharge status: Home / Self Care | Payer: Medicare Other | Source: Ambulatory Visit | Attending: Obstetrics & Gynecology | Admitting: Obstetrics & Gynecology

## 2019-05-29 ENCOUNTER — Other Ambulatory Visit: Payer: Self-pay

## 2019-05-29 VITALS — BP 112/68 | HR 72 | Temp 97.3°F | Resp 10 | Ht 64.25 in | Wt 181.8 lb

## 2019-05-29 DIAGNOSIS — Z803 Family history of malignant neoplasm of breast: Secondary | ICD-10-CM | POA: Diagnosis not present

## 2019-05-29 DIAGNOSIS — Z1151 Encounter for screening for human papillomavirus (HPV): Secondary | ICD-10-CM | POA: Insufficient documentation

## 2019-05-29 DIAGNOSIS — Z01419 Encounter for gynecological examination (general) (routine) without abnormal findings: Secondary | ICD-10-CM | POA: Diagnosis not present

## 2019-05-29 DIAGNOSIS — Z124 Encounter for screening for malignant neoplasm of cervix: Secondary | ICD-10-CM

## 2019-05-29 DIAGNOSIS — Z780 Asymptomatic menopausal state: Secondary | ICD-10-CM | POA: Insufficient documentation

## 2019-05-29 NOTE — Progress Notes (Signed)
64 y.o. G0P0 Single White or Caucasian female here for annual exam.  Helping to care for her mother.  She's been vaccinated for Covid when she was helping her mother get her vaccination.  She is so happy that she was able to get the vaccination.    Interested in having abbreviated MRI.  Tyrer Cusick model with 19.2% lifetime risk for breast cancer.    Having RUQ pain.  Had gall stones but there was no inflammation.  Having a HIDA scan set up.  Patient's last menstrual period was 12/31/2008.          Sexually active: No.  The current method of family planning is post menopausal status.    Exercising: No.  The patient does not participate in regular exercise at present. Smoker:  no  Health Maintenance: Pap:  01/21/18 Neg  01/11/17 Neg  01/02/16 Neg:Neg HR HPV History of abnormal Pap:  no MMG:  11/11/18 BIRADS 1 negative/density c Colonoscopy:  07/31/17 Normal. F/u 5 years.  Endoscopy was also done. BMD:   08/11/14 Osteoporosis TDaP:  02/15/11  Pneumonia vaccine(s):  n/a Shingrix:   Discussed today Hep C testing: 01/02/16 Neg Screening Labs: PCP   reports that she has never smoked. She has never used smokeless tobacco. She reports that she does not drink alcohol or use drugs.  Past Medical History:  Diagnosis Date  . Anxiety   . Basal cell carcinoma of face    forehead  . CFS (chronic fatigue syndrome)   . Chronic fatigue 08/01/2017  . Chronic insomnia 01/20/2016  . Common migraine with intractable migraine 01/20/2016  . Dense breasts 04/15/2014  . Depression   . Diverticulosis 08/01/2017  . Family history of breast cancer in first degree relative 04/15/2014  . Fibromyalgia   . Gallstones   . Generalized anxiety disorder 08/31/2015  . GERD (gastroesophageal reflux disease) 08/01/2017  . Hematuria    neg eval Dr. Joelyn Oms, 2000  . MDD (major depressive disorder), recurrent episode, severe ([place]) 04/08/2013  . Migraine   . Osteoporosis 10/05/2014  . Renal cyst   . Sciatica    bulging L5-S1   . Tachycardia    negative stress test    Past Surgical History:  Procedure Laterality Date  . MOHS SURGERY  9/08   Dr Eula Fried    Current Outpatient Medications  Medication Sig Dispense Refill  . B Complex Vitamins (VITAMIN B COMPLEX PO) Take by mouth daily.    . bisoprolol (ZEBETA) 5 MG tablet Take 2.5 mg by mouth daily.   3  . Brexpiprazole (REXULTI) 0.5 MG TABS Take 0.25 tablets by mouth daily.     Marland Kitchen buPROPion (WELLBUTRIN SR) 100 MG 12 hr tablet     . clonazePAM (KLONOPIN) 1 MG tablet Take 0.5 mg by mouth at bedtime and may repeat dose one time if needed. Take 0.5mg  in the morning and evening and 1mg  at bedtime    . Coenzyme Q10 (CO Q 10 PO) Take by mouth.    . desvenlafaxine (PRISTIQ) 100 MG 24 hr tablet     . famotidine (PEPCID) 20 MG tablet daily.    . fish oil-omega-3 fatty acids 1000 MG capsule Take 3 g by mouth daily.     Marland Kitchen FLUoxetine (PROZAC) 20 MG capsule Take 1 capsule by mouth daily.   1  . KETAMINE HCL IV Inject into the vein. Every 3 weeks  Dr. Lorri Frederick    . MILK THISTLE PO Take by mouth.    Marland Kitchen NON  FORMULARY Green drink with greens    . Probiotic Product (PROBIOTIC DAILY) CAPS Take by mouth daily.    . promethazine (PHENERGAN) 25 MG tablet as needed.     . rizatriptan (MAXALT) 10 MG tablet as needed.    . traZODone (DESYREL) 50 MG tablet Take 50 mg by mouth at bedtime.    . TURMERIC PO Take by mouth daily.     No current facility-administered medications for this visit.    Family History  Problem Relation Age of Onset  . Colon cancer Father 35          . Depression Father   . Breast cancer Mother 43       her 2 neu+  . Hypertension Mother   . Heart Problems Mother        pacemaker placed  . Heart disease Maternal Grandmother   . Heart disease Paternal Grandmother   . Breast cancer Maternal Aunt        maternal great aunt    Review of Systems  All other systems reviewed and are negative.   Exam:   BP 112/68 (BP Location: Right Arm, Patient  Position: Sitting, Cuff Size: Normal)   Pulse 72   Temp (!) 97.3 F (36.3 C) (Temporal)   Resp 10   Ht 5' 4.25" (1.632 m)   Wt 181 lb 12.8 oz (82.5 kg)   LMP 12/31/2008   BMI 30.96 kg/m   Height: 5' 4.25" (163.2 cm)  Ht Readings from Last 3 Encounters:  05/29/19 5' 4.25" (1.632 m)  10/16/18 5' 4.5" (1.638 m)  01/21/18 5' 4.25" (1.632 m)   General appearance: alert, cooperative and appears stated age Head: Normocephalic, without obvious abnormality, atraumatic Neck: no adenopathy, supple, symmetrical, trachea midline and thyroid normal to inspection and palpation Lungs: clear to auscultation bilaterally Breasts: normal appearance, no masses or tenderness Heart: regular rate and rhythm Abdomen: soft, non-tender; bowel sounds normal; no masses,  no organomegaly Extremities: extremities normal, atraumatic, no cyanosis or edema Skin: Skin color, texture, turgor normal. No rashes or lesions Lymph nodes: Cervical, supraclavicular, and axillary nodes normal. No abnormal inguinal nodes palpated Neurologic: Grossly normal   Pelvic: External genitalia:  no lesions              Urethra:  normal appearing urethra with no masses, tenderness or lesions              Bartholins and Skenes: normal                 Vagina: normal appearing vagina with normal color and discharge, no lesions              Cervix: no lesions              Pap taken: Yes.   Bimanual Exam:  Uterus:  normal size, contour, position, consistency, mobility, non-tender              Adnexa: normal adnexa and no mass, fullness, tenderness               Rectovaginal: Confirms               Anus:  normal sphincter tone, no lesions  Chaperone, Terence Lux, CMA, was present for exam.  A:  Well Woman with normal exam PMP, no HRT H/o depression.  Has been on multiple medications regimens and did have ECT  Family hx of breast cancer in her mother Omar Person model 19.2% lifetime risk of breast  cancer with pt Chronic  fatigue/fibromyalgia H/o gallstones, RUQ pain Mildly decreased GFR  P:   Mammogram guidelines reviewed.  Limited breast is going to be scheduled Follow up breast check in 6 months Pap with HR HPV obtained today Having additional GI evaluation scheduled Lab work done 05/20/2019.  Reviewed in [place] BMD with MMG in August.  Order will be faxed to [facility]. Return annually or prn

## 2019-05-29 NOTE — Progress Notes (Signed)
Spoke with Judson Roch and [facility]. Pt information given and limited breast MRI orders placed. Sarah to give pt a call to set up appt.   Routing to Dr Sabra Heck for review and will close encounter.

## 2019-06-02 LAB — CYTOLOGY - PAP
Comment: NEGATIVE
Diagnosis: NEGATIVE
High risk HPV: NEGATIVE

## 2019-06-12 ENCOUNTER — Encounter: Payer: Self-pay | Admitting: Obstetrics & Gynecology

## 2019-06-12 ENCOUNTER — Telehealth: Payer: Self-pay | Admitting: *Deleted

## 2019-06-12 NOTE — Telephone Encounter (Signed)
Call to patient, she is driving, she will return call to office at a later time.   [name], MD  06/02/2019 6:29 PM EST    02 recall.

## 2019-06-12 NOTE — Telephone Encounter (Signed)
Spoke with patient.   Advised of PAP results. Next AEX 08/26/20  Patient does not have Hx of DM. Provided patient with nutritionist, Lorayne Bender at [employer] in [place].   Patient request recommendations for therapist in [place] for depression, provided patient [facility] [phone].   Advised patient Dr. Sabra Heck will review, I will return call to office if any additional recommendations. Patient agreeable.   Routing to provider for final review. Patient is agreeable to disposition. Will close encounter.

## 2019-06-12 NOTE — Telephone Encounter (Signed)
Patient is returning call to Jill, RN.  °

## 2019-06-12 NOTE — Telephone Encounter (Signed)
[place], [facility]  Phone Number: [phone]  Dr. Sabra Heck... I had a couple of questions I meant to ask you at my annual and forgot. Do you have any recommendations for a nutritionist? I need to have someone who will hold me accountable. Also, my psychologist is retiring and I wondered if you had any recommendations for a therapist? I am sorry to bother you. Yet you have some really good recommendations. Lastly, I have not heard from my Pap smear results. I am hoping no news is good news. Hope you are enjoying this beautiful weather. Thank you. Shirl Harris.

## 2019-06-29 ENCOUNTER — Other Ambulatory Visit: Payer: Self-pay

## 2019-06-29 ENCOUNTER — Ambulatory Visit
Admission: RE | Admit: 2019-06-29 | Discharge: 2019-06-29 | Disposition: A | Discharge status: Home / Self Care | Payer: Self-pay | Source: Ambulatory Visit | Attending: Obstetrics & Gynecology | Admitting: Obstetrics & Gynecology

## 2019-06-29 DIAGNOSIS — Z803 Family history of malignant neoplasm of breast: Secondary | ICD-10-CM

## 2019-06-29 MED ORDER — GADOBUTROL 1 MMOL/ML IV SOLN
8.0000 mL | Freq: Once | INTRAVENOUS | Status: AC | PRN
  Administered 2019-06-29: 8 mL via INTRAVENOUS

## 2019-06-30 ENCOUNTER — Other Ambulatory Visit: Payer: Self-pay | Admitting: *Deleted

## 2019-06-30 DIAGNOSIS — Z803 Family history of malignant neoplasm of breast: Secondary | ICD-10-CM

## 2019-06-30 DIAGNOSIS — R922 Inconclusive mammogram: Secondary | ICD-10-CM

## 2019-06-30 NOTE — Addendum Note (Signed)
Addended by: Burnice Logan on: 06/30/2019 08:52 AM   Modules accepted: Orders

## 2019-09-04 ENCOUNTER — Telehealth: Payer: Self-pay

## 2019-09-04 ENCOUNTER — Encounter: Payer: Self-pay | Admitting: Obstetrics & Gynecology

## 2019-09-04 DIAGNOSIS — R102 Pelvic and perineal pain: Secondary | ICD-10-CM

## 2019-09-04 NOTE — Telephone Encounter (Signed)
Spoke with patient. Patient reports intermittent, dull,  right sided pelvic pain for the past few months. Intensity has not changed. Denies any pain right now. Regular BMs, last BM 09/03/19. Denies vaginal bleeding, odor, urinary symptoms, N/V, fever/chills. Has H/O RUQ pain and gallstones.   Last AEX 05/29/19. Last PUS 06/2014.   PUS scheduled for 09/17/19 at 3pm, consult to follow with Dr. Sabra Heck. Advised patient to return call to office if symptoms worsen or new symptoms develop. Will review with Dr. Sabra Heck and return call if any additional recommendations. Patient agreeable.   Order pended for PUS  Dr. Sabra Heck -ok to proceed as scheduled?

## 2019-09-04 NOTE — Telephone Encounter (Signed)
[place], [facility]  Phone Number: [phone]  Dr. Sabra Heck,  I have been having a dull pain on my lower right side where my ovary is for a while now... several months. And it is not constant, I can go a couple of days and not have it. And then I will have it several times a day for several days. Sometimes it almost feels like pressure there rather than pain. Trying to describe it the best I can.Marland KitchenMarland KitchenI just wanted to ask you whether you thought I needed to come in or not... I hope you have a good weekend. Allison Cruz

## 2019-09-09 ENCOUNTER — Telehealth: Payer: Self-pay | Admitting: Obstetrics & Gynecology

## 2019-09-09 NOTE — Telephone Encounter (Signed)
PUS orders placed.  Cc: Hayley for precert Encounter closed.

## 2019-09-09 NOTE — Telephone Encounter (Signed)
Call to patient. Per DPR, OK to leave message on voicemail.   Left voicemail requesting a return call to [facility] to review benefits for scheduled Pelvic ultrasound with Jerilynn Mages. Edwinna Areola, MD

## 2019-09-09 NOTE — Telephone Encounter (Signed)
Spoke with patient regarding benefits for recommended ultrasound. Patient is aware that ultrasound is transvaginal. Patient acknowledges understanding of information presented. Patient is aware of cancellation policy. Encounter closed.

## 2019-09-09 NOTE — Telephone Encounter (Signed)
Yes, ok to proceed with scheduling.  Thanks. 

## 2019-09-16 ENCOUNTER — Other Ambulatory Visit: Payer: Self-pay

## 2019-09-17 ENCOUNTER — Other Ambulatory Visit: Payer: BC Managed Care – PPO

## 2019-09-17 ENCOUNTER — Other Ambulatory Visit: Payer: Self-pay | Admitting: Obstetrics & Gynecology

## 2019-09-17 ENCOUNTER — Telehealth: Payer: Self-pay

## 2019-09-17 NOTE — Telephone Encounter (Signed)
PUS for pelvic pain.   Routing to Dr. Lestine Box.

## 2019-09-17 NOTE — Telephone Encounter (Signed)
Patient returned call to [place]. Patient will not be available for return call and will contact the office when she returns home.. She said " it may be a few weeks before I will call to rescdule due to leaving town to be with my mother who took a few falls"

## 2019-09-17 NOTE — Telephone Encounter (Signed)
Patient left message to cancel [place] appointment for today due to family emergency with her mother. Patient stated she was aware of fee and sorry to cancel.

## 2019-09-17 NOTE — Telephone Encounter (Signed)
Left message to call Halaina Vanduzer, RN at [facility] [phone].   

## 2019-10-12 ENCOUNTER — Telehealth: Payer: Self-pay | Admitting: *Deleted

## 2019-10-12 NOTE — Telephone Encounter (Signed)
Left message on voicemail to call and reschedule cancelled appointment. °

## 2019-11-03 ENCOUNTER — Telehealth: Payer: Self-pay

## 2019-11-03 NOTE — Telephone Encounter (Signed)
Spoke with pt. Pt states wanting to reschedule PUS for pelvic pain. Pt states is home now from taking care of mother. Pt rescheduled for 8/26 at 100 pm. Pt agreeable and verbalized understanding of date and time of appt. Reviewed Cancellation policy. Pt agreeable.   Routing to Dr Sabra Heck for update.  Encounter closed Cc: Hayley for update on PUS. Pt states can leave detailed message for benefits if no answer. Pt's insurance verified.

## 2019-11-03 NOTE — Telephone Encounter (Signed)
Patient is calling in regards to reschedule [place] visit.

## 2019-11-05 ENCOUNTER — Telehealth: Payer: Self-pay

## 2019-11-05 ENCOUNTER — Encounter: Payer: Self-pay | Admitting: Obstetrics & Gynecology

## 2019-11-05 DIAGNOSIS — N939 Abnormal uterine and vaginal bleeding, unspecified: Secondary | ICD-10-CM

## 2019-11-05 NOTE — Telephone Encounter (Signed)
[facility] Clinical Pool Dr. Sabra Heck. About a week ago I went to the bathroom and did not notice anything on my toilet paper but when I looked back in the toilet, there was a brownish color residue on the toilet paper. There has been nothing since. I was just not going to say anything and let you know if it happens again. Yet that is a question that you always ask when I come in for an appointment. I decided that I needed to let you know. I just have had much on my mind with my mom. But I can't take care of her if I don't take care of me. Thank you, Allison Cruz

## 2019-11-05 NOTE — Telephone Encounter (Signed)
Pt sent mychart message with update to Dr Sabra Heck about brown spotting x 1. Pt has scheduled PUS on 11/26/19  Routing to Dr Sabra Heck for any additional recommendations/advice.

## 2019-11-05 NOTE — Telephone Encounter (Signed)
I think it's ok to wait for her ultrasound to check the endometrial lining.  If thickened at that time, I will do an endometrial biopsy.

## 2019-11-06 NOTE — Telephone Encounter (Signed)
Spoke with pt. Pt given update and recommendations per Dr Sabra Heck. Pt agreeable and verbalized understanding.   EMB orders placed for possible procedure at PUS appt on 11/26/19. Cc: Alfonse Spruce for precert. Pt aware.  Encounter closed.

## 2019-11-12 ENCOUNTER — Ambulatory Visit: Payer: Self-pay | Admitting: Obstetrics & Gynecology

## 2019-11-23 ENCOUNTER — Telehealth: Payer: Self-pay | Admitting: Obstetrics & Gynecology

## 2019-11-23 NOTE — Telephone Encounter (Signed)
Patient canceled her upcoming PUS possible EMB appointment "due to being around someone with covid symptoms".  She is "in quarantine and would like to reschedule this appointment".

## 2019-11-23 NOTE — Telephone Encounter (Signed)
Spoke with pt. Pt states having Covid exposure at work and had PUS/EMB scheduled for 11/26/19. Pt states it would only be 9 days from quarantine and wants to reschedule. Agreeable to reschedule appt.  Pt scheduled for PUS and EMB on 12/10/19 at 1230 pm with Dr Sabra Heck. Pt agreeable and verbalized understanding of date and time of appt.  Encounter closed.

## 2019-11-26 ENCOUNTER — Other Ambulatory Visit: Payer: Self-pay | Admitting: Obstetrics & Gynecology

## 2019-11-26 ENCOUNTER — Other Ambulatory Visit: Payer: Self-pay

## 2019-12-10 ENCOUNTER — Ambulatory Visit (INDEPENDENT_AMBULATORY_CARE_PROVIDER_SITE_OTHER): Payer: Medicare Other | Admitting: Obstetrics & Gynecology

## 2019-12-10 ENCOUNTER — Other Ambulatory Visit: Payer: Self-pay

## 2019-12-10 ENCOUNTER — Ambulatory Visit (INDEPENDENT_AMBULATORY_CARE_PROVIDER_SITE_OTHER): Payer: Medicare Other

## 2019-12-10 ENCOUNTER — Encounter: Payer: Self-pay | Admitting: Obstetrics & Gynecology

## 2019-12-10 VITALS — BP 110/64 | HR 68 | Resp 16 | Wt 180.0 lb

## 2019-12-10 DIAGNOSIS — Z803 Family history of malignant neoplasm of breast: Secondary | ICD-10-CM

## 2019-12-10 DIAGNOSIS — R102 Pelvic and perineal pain: Secondary | ICD-10-CM

## 2019-12-10 DIAGNOSIS — N950 Postmenopausal bleeding: Secondary | ICD-10-CM | POA: Diagnosis not present

## 2019-12-10 DIAGNOSIS — N939 Abnormal uterine and vaginal bleeding, unspecified: Secondary | ICD-10-CM

## 2019-12-10 NOTE — Progress Notes (Signed)
64 y.o. G0P0 Single White or Caucasian female here for pelvic ultrasound due to PMP bleeding.  Pt was scheduled for ultrasound but was rescheduled due to mother needing help with care.  Pt's mother lives in [place].  Pt also here for breast exam that she does every six months.  Does have MMG scheduled.  Had breast MRI 06/2019 that was normal.  Denies any new breast symptoms/concerns.  Patient's last menstrual period was 12/31/2008.  Contraception: PMP  Findings:  UTERUS: 3/9 x 2.6 x 2.0cm  EMS: 1.31mm ADNEXA: Left ovary: 2.3 x 1.3 x 0.8cm       Right ovary:  2.1 x 0.9 x 0.8cm CUL DE SAC:  No free fluid  Discussion:  Ultrasonographer supervised.  Images reviewed with pt.  Endometrium <44mm.  As she has experienced no additional bleeding do not feel needs endometrial biopsy today.  She knows to let me know if has any new bleeding.  Physical Exam Constitutional:      Appearance: Normal appearance.  Chest:     Breasts:        Right: Normal. No inverted nipple, nipple discharge, skin change or tenderness.        Left: Normal. No inverted nipple, mass, nipple discharge, skin change or tenderness.  Lymphadenopathy:     Upper Body:     Right upper body: No supraclavicular, axillary or pectoral adenopathy.     Left upper body: No supraclavicular or axillary adenopathy.  Neurological:     Mental Status: She is alert.      Assessment:  PMP bleeding x 1, normal appearing ultrasound with normal endometrium today Family hx of breast cancer with her mother and increased lifetime risk of breast cancer  Plan:  Pt reassured by ultrasound.  No biopsy obtained today MMG is scheduled Will follow 6 months for AEX  25 minutes total spent with pt today

## 2019-12-10 NOTE — Patient Instructions (Signed)
[facility] at [facility] [place], [place] [address]  [place]: [phone] Fax: [phone] Hours (M-F): 8am - 5pm   Providers Billey Gosling, MD Pricilla Holm, MD   [facility] on [facility] [address]. Cristie Hem

## 2019-12-22 ENCOUNTER — Telehealth: Payer: Self-pay

## 2019-12-22 NOTE — Telephone Encounter (Signed)
Patient notified of results. See bone density scanned in

## 2019-12-22 NOTE — Telephone Encounter (Signed)
Patient returned call

## 2019-12-22 NOTE — Telephone Encounter (Signed)
Left message for patient to callback regarding bone density results

## 2020-01-13 ENCOUNTER — Encounter: Payer: Self-pay | Admitting: Obstetrics & Gynecology

## 2020-07-01 ENCOUNTER — Encounter (HOSPITAL_BASED_OUTPATIENT_CLINIC_OR_DEPARTMENT_OTHER): Payer: Self-pay

## 2020-07-05 ENCOUNTER — Ambulatory Visit
Admission: RE | Admit: 2020-07-05 | Discharge: 2020-07-05 | Disposition: A | Discharge status: Home / Self Care | Payer: Self-pay | Source: Ambulatory Visit | Attending: Obstetrics & Gynecology | Admitting: Obstetrics & Gynecology

## 2020-07-05 DIAGNOSIS — R922 Inconclusive mammogram: Secondary | ICD-10-CM

## 2020-07-05 DIAGNOSIS — Z803 Family history of malignant neoplasm of breast: Secondary | ICD-10-CM

## 2020-07-05 MED ORDER — GADOBUTROL 1 MMOL/ML IV SOLN
8.0000 mL | Freq: Once | INTRAVENOUS | Status: AC | PRN
  Administered 2020-07-05: 8 mL via INTRAVENOUS

## 2020-08-15 ENCOUNTER — Encounter (HOSPITAL_BASED_OUTPATIENT_CLINIC_OR_DEPARTMENT_OTHER): Payer: Self-pay | Admitting: Obstetrics & Gynecology

## 2020-08-15 ENCOUNTER — Other Ambulatory Visit: Payer: Self-pay

## 2020-08-15 ENCOUNTER — Other Ambulatory Visit (HOSPITAL_BASED_OUTPATIENT_CLINIC_OR_DEPARTMENT_OTHER)
Admission: RE | Admit: 2020-08-15 | Discharge: 2020-08-15 | Disposition: A | Discharge status: Home / Self Care | Payer: Medicare Other | Source: Ambulatory Visit | Attending: Obstetrics & Gynecology | Admitting: Obstetrics & Gynecology

## 2020-08-15 ENCOUNTER — Ambulatory Visit (INDEPENDENT_AMBULATORY_CARE_PROVIDER_SITE_OTHER): Payer: Medicare Other | Admitting: Obstetrics & Gynecology

## 2020-08-15 ENCOUNTER — Other Ambulatory Visit (HOSPITAL_COMMUNITY)
Admission: RE | Admit: 2020-08-15 | Discharge: 2020-08-15 | Disposition: A | Discharge status: Home / Self Care | Payer: Medicare Other | Source: Ambulatory Visit | Attending: Obstetrics & Gynecology | Admitting: Obstetrics & Gynecology

## 2020-08-15 ENCOUNTER — Other Ambulatory Visit (HOSPITAL_BASED_OUTPATIENT_CLINIC_OR_DEPARTMENT_OTHER): Payer: Self-pay

## 2020-08-15 ENCOUNTER — Other Ambulatory Visit (HOSPITAL_BASED_OUTPATIENT_CLINIC_OR_DEPARTMENT_OTHER): Payer: No Typology Code available for payment source

## 2020-08-15 VITALS — BP 128/85 | HR 82 | Ht 64.0 in | Wt 177.0 lb

## 2020-08-15 DIAGNOSIS — Z860100 Personal history of colon polyps, unspecified: Secondary | ICD-10-CM

## 2020-08-15 DIAGNOSIS — Z124 Encounter for screening for malignant neoplasm of cervix: Secondary | ICD-10-CM

## 2020-08-15 DIAGNOSIS — Z780 Asymptomatic menopausal state: Secondary | ICD-10-CM | POA: Diagnosis not present

## 2020-08-15 DIAGNOSIS — Z9189 Other specified personal risk factors, not elsewhere classified: Secondary | ICD-10-CM

## 2020-08-15 DIAGNOSIS — Z01419 Encounter for gynecological examination (general) (routine) without abnormal findings: Secondary | ICD-10-CM

## 2020-08-15 DIAGNOSIS — Z8659 Personal history of other mental and behavioral disorders: Secondary | ICD-10-CM

## 2020-08-15 DIAGNOSIS — R300 Dysuria: Secondary | ICD-10-CM

## 2020-08-15 DIAGNOSIS — Z803 Family history of malignant neoplasm of breast: Secondary | ICD-10-CM

## 2020-08-15 DIAGNOSIS — R8271 Bacteriuria: Secondary | ICD-10-CM

## 2020-08-15 DIAGNOSIS — R350 Frequency of micturition: Secondary | ICD-10-CM | POA: Diagnosis not present

## 2020-08-15 LAB — POCT URINALYSIS DIPSTICK
Appearance: NORMAL
Bilirubin, UA: NEGATIVE
Blood, UA: NEGATIVE
Glucose, UA: NEGATIVE
Ketones, UA: NEGATIVE
Leukocytes, UA: NEGATIVE
Nitrite, UA: NEGATIVE
Protein, UA: NEGATIVE
Spec Grav, UA: 1.02 (ref 1.010–1.025)
Urobilinogen, UA: 0.2 E.U./dL
pH, UA: 5.5 (ref 5.0–8.0)

## 2020-08-15 NOTE — Progress Notes (Signed)
65 y.o. G0P0 Single White or Caucasian female here for breast and pelvic exam.  She has family history of breast cancer.  Lifetime risk of breast cancer is >20%.  Doing breast MRIs.  Prior risk calculation was 19% but more recent calculation is above 20%.  Discussed with pt different MRI recommendations for this.  Should plan full breast MRI with next one.  Not due until next year.  Reports she does have some dysuria.  No fever.  She is having some pelvic pain.  This is mild and not present all of the time.  Has experienced some vaginal dryness.  Used replens.  Had intercourse the same day.  Had some brownish discharge.  Last ultrasound was 12/10/2019 with endometrium 1.32mm.    Patient's last menstrual period was 12/31/2008.          Sexually active: No.  H/O STD:  no  Health Maintenance: PCP:  Dr. Theodis Sato  Last wellness appt was 05/2020.  Did blood work at that appt:  yes Vaccines are up to date:  yes Colonoscopy:  5/19, follow up 5 years MMG:  12/16/2019 BMD:  2021 T score -2.5 Last pap smear:  2021.   H/o abnormal pap smear:  no   reports that she has never smoked. She has never used smokeless tobacco. She reports that she does not drink alcohol and does not use drugs.  Past Medical History:  Diagnosis Date  . Anxiety   . Basal cell carcinoma of face    forehead  . CFS (chronic fatigue syndrome)   . Chronic fatigue 08/01/2017  . Chronic insomnia 01/20/2016  . Common migraine with intractable migraine 01/20/2016  . Dense breasts 04/15/2014  . Depression   . Diverticulosis 08/01/2017  . Family history of breast cancer in first degree relative 04/15/2014  . Fibromyalgia   . Gallstones   . Generalized anxiety disorder 08/31/2015  . GERD (gastroesophageal reflux disease) 08/01/2017  . Hematuria    neg eval Dr. Joelyn Oms, 2000  . MDD (major depressive disorder), recurrent episode, severe ([place]) 04/08/2013  . Migraine   . Osteoporosis 10/05/2014  . Renal cyst   . Sciatica    bulging L5-S1  .  Tachycardia    negative stress test    Past Surgical History:  Procedure Laterality Date  . MOHS SURGERY  9/08   Dr Eula Fried    Current Outpatient Medications  Medication Sig Dispense Refill  . B Complex Vitamins (VITAMIN B COMPLEX PO) Take by mouth daily.    . bisoprolol (ZEBETA) 5 MG tablet Take 2.5 mg by mouth daily.   3  . brexpiprazole (REXULTI) 1 MG TABS tablet Take by mouth.    Marland Kitchen buPROPion (WELLBUTRIN SR) 150 MG 12 hr tablet Taking 150mg  2x a day    . clonazePAM (KLONOPIN) 1 MG tablet Take 0.5 mg by mouth at bedtime and may repeat dose one time if needed. Take 0.5mg  in the morning and evening and 1mg  at bedtime    . Coenzyme Q10 (CO Q 10 PO) Take by mouth.    . desvenlafaxine (PRISTIQ) 100 MG 24 hr tablet     . famotidine (PEPCID) 20 MG tablet daily.    . fish oil-omega-3 fatty acids 1000 MG capsule Take 3 g by mouth daily.     Marland Kitchen FLUoxetine (PROZAC) 20 MG capsule Take 1 capsule by mouth daily.   1  . KETAMINE HCL IV Inject into the vein. Every 4 weeks  Dr. Lorri Frederick    .  MILK THISTLE PO Take by mouth.    . NON FORMULARY Green drink with greens    . Probiotic Product (PROBIOTIC DAILY) CAPS Take by mouth daily.    . promethazine (PHENERGAN) 25 MG tablet as needed.     . rizatriptan (MAXALT) 10 MG tablet as needed.    . traZODone (DESYREL) 50 MG tablet Take 50 mg by mouth at bedtime.    . TURMERIC PO Take by mouth daily.     No current facility-administered medications for this visit.    Family History  Problem Relation Age of Onset  . Colon cancer Father 75          . Depression Father   . Breast cancer Mother 20       her 2 neu+  . Hypertension Mother   . Heart Problems Mother        pacemaker placed  . Heart disease Maternal Grandmother   . Heart disease Paternal Grandmother   . Breast cancer Maternal Aunt        maternal great aunt  . Dementia Maternal Aunt     Review of Systems  Constitutional: Negative.   Gastrointestinal: Negative.   Genitourinary:  Positive for dysuria and urgency. Negative for vaginal bleeding.    Exam:   BP 128/85   Pulse 82   Ht 5\' 4"  (1.626 m)   Wt 177 lb (80.3 kg)   LMP 12/31/2008   BMI 30.38 kg/m   Height: 5\' 4"  (162.6 cm)  General appearance: alert, cooperative and appears stated age Breasts: normal appearance, no masses or tenderness Abdomen: soft, non-tender; bowel sounds normal; no masses,  no organomegaly Lymph nodes: Cervical, supraclavicular, and axillary nodes normal.  No abnormal inguinal nodes palpated Neurologic: Grossly normal  Pelvic: External genitalia:  no lesions              Urethra:  normal appearing urethra with no masses, tenderness or lesions              Bartholins and Skenes: normal                 Vagina: normal appearing vagina with atrophic changes and no discharge, no lesions              Cervix: no lesions              Pap taken: Yes.   Bimanual Exam:  Uterus:  normal size, contour, position, consistency, mobility, non-tender              Adnexa: normal adnexa and no mass, fullness, tenderness               Rectovaginal: Confirms               Anus:  normal sphincter tone, no lesions  Chaperone, Octaviano Batty, CMA, was present for exam.  Assessment/Plan: 1. Encntr for gyn exam (general) (routine) w/o abn findings - pap obtained per pt request.  She is aware of actual guideline recommendations. - MMG 12/2019.  Breast MRI 07/2020. - Colonoscopy 5/19, follow up 5 years. - BMD 2021 with T score -2.5.  Plan to repeat next year - lab work with Dr. Theodis Sato - Vaccines updated  2. Postmenopausal - no HRT  3. Family history of breast cancer in first degree relative  4. Increased risk of breast cancer - having yearly breat MRI  5. Personal history of colonic polyps  6. History of depression  7. Dysuria - Urine Culture; Future

## 2020-08-16 DIAGNOSIS — Z9189 Other specified personal risk factors, not elsewhere classified: Secondary | ICD-10-CM | POA: Insufficient documentation

## 2020-08-17 LAB — URINE CULTURE

## 2020-08-18 LAB — CYTOLOGY - PAP: Diagnosis: NEGATIVE

## 2020-08-21 NOTE — Addendum Note (Signed)
Addended by: [name] on: 08/21/2020 11:17 PM   Modules accepted: Orders

## 2020-08-24 ENCOUNTER — Other Ambulatory Visit (HOSPITAL_BASED_OUTPATIENT_CLINIC_OR_DEPARTMENT_OTHER): Payer: No Typology Code available for payment source

## 2020-08-26 ENCOUNTER — Ambulatory Visit: Payer: BC Managed Care – PPO

## 2020-08-31 ENCOUNTER — Other Ambulatory Visit (HOSPITAL_BASED_OUTPATIENT_CLINIC_OR_DEPARTMENT_OTHER)
Admission: RE | Admit: 2020-08-31 | Discharge: 2020-08-31 | Disposition: A | Discharge status: Home / Self Care | Payer: Medicare Other | Source: Ambulatory Visit | Attending: Obstetrics & Gynecology | Admitting: Obstetrics & Gynecology

## 2020-08-31 ENCOUNTER — Other Ambulatory Visit: Payer: Self-pay

## 2020-08-31 DIAGNOSIS — R8271 Bacteriuria: Secondary | ICD-10-CM | POA: Diagnosis present

## 2020-09-02 LAB — URINE CULTURE: Culture: NO GROWTH

## 2021-01-05 ENCOUNTER — Encounter (HOSPITAL_BASED_OUTPATIENT_CLINIC_OR_DEPARTMENT_OTHER): Payer: Self-pay | Admitting: Obstetrics & Gynecology

## 2021-02-16 ENCOUNTER — Ambulatory Visit (INDEPENDENT_AMBULATORY_CARE_PROVIDER_SITE_OTHER): Payer: Medicare Other | Admitting: Obstetrics & Gynecology

## 2021-02-16 ENCOUNTER — Other Ambulatory Visit: Payer: Self-pay

## 2021-02-16 ENCOUNTER — Encounter (HOSPITAL_BASED_OUTPATIENT_CLINIC_OR_DEPARTMENT_OTHER): Payer: Self-pay | Admitting: Obstetrics & Gynecology

## 2021-02-16 VITALS — BP 120/82 | HR 80 | Ht 64.5 in | Wt 171.0 lb

## 2021-02-16 DIAGNOSIS — Z9189 Other specified personal risk factors, not elsewhere classified: Secondary | ICD-10-CM | POA: Diagnosis not present

## 2021-02-16 DIAGNOSIS — Z803 Family history of malignant neoplasm of breast: Secondary | ICD-10-CM | POA: Diagnosis not present

## 2021-02-16 NOTE — Progress Notes (Signed)
GYNECOLOGY  VISIT  CC:   breast check  HPI: 65 y.o. G0P0 Single White or Caucasian female here for breast check.  Family hx of breast cancer in her mother.  Having every six month breast checks and have MMG with breast screening breast MRI every six months.  Recalculation last year of breast cancer risk increased risk for 23%.  We discussed the differences in diagnostic and screening breast MRI.  She is fine proceeding with full diagnostic breast MRI with next imaging.  Aware this is due in April.  Will need to precert prior to scheduling.  Process discussed and pt voices understanding.  Denies new breast issues.  Last MMG was 01/02/2021.  Reviewed again today.  Her dog has some GI issue this past week.  She ended up taking her dog to the vet in the middle of the night.  Dog is 7 1/2 and is better now.    Continues to work on treatments for long term depression.  She is on IV ketamine that she receives every 4 weeks.  Is going to have this every 3 weeks.  Receives this in [place] with provider who is part of [place].    No vaginal bleeding.    GYNECOLOGIC HISTORY: Patient's last menstrual period was 12/31/2008.   Patient Active Problem List   Diagnosis Date Noted   Increased risk of breast cancer 08/16/2020   Personal history of colonic polyps 08/15/2020   Renal insufficiency 05/06/2019   Chronic fatigue 08/01/2017   Diverticulosis 08/01/2017   GERD (gastroesophageal reflux disease) 08/01/2017   Common migraine with intractable migraine 01/20/2016   Chronic insomnia 01/20/2016   Fibromyalgia 10/12/2015   Generalized anxiety disorder 08/31/2015   Osteoporosis 10/05/2014   Dense breasts 04/15/2014   Family history of breast cancer in first degree relative 04/15/2014   MDD (major depressive disorder), recurrent episode, severe ([place]) 04/08/2013    Past Medical History:  Diagnosis Date   Anxiety    Basal cell carcinoma of face    forehead   CFS (chronic fatigue syndrome)     Chronic fatigue 08/01/2017   Chronic insomnia 01/20/2016   Common migraine with intractable migraine 01/20/2016   Dense breasts 04/15/2014   Depression    Diverticulosis 08/01/2017   Family history of breast cancer in first degree relative 04/15/2014   Fibromyalgia    Gallstones    Generalized anxiety disorder 08/31/2015   GERD (gastroesophageal reflux disease) 08/01/2017   Hematuria    neg eval Dr. Joelyn Oms, 2000   MDD (major depressive disorder), recurrent episode, severe ([place]) 04/08/2013   Migraine    Osteoporosis 10/05/2014   Renal cyst    Sciatica    bulging L5-S1   Tachycardia    negative stress test    Past Surgical History:  Procedure Laterality Date   MOHS SURGERY  9/08   Dr Eula Fried    MEDS:   Current Outpatient Medications on File Prior to Visit  Medication Sig Dispense Refill   B Complex Vitamins (VITAMIN B COMPLEX PO) Take by mouth daily.     bisoprolol (ZEBETA) 5 MG tablet Take 2.5 mg by mouth daily.   3   brexpiprazole (REXULTI) 1 MG TABS tablet Take by mouth.     buPROPion (WELLBUTRIN SR) 150 MG 12 hr tablet Taking 150mg  2x a day     clonazePAM (KLONOPIN) 1 MG tablet Take 0.5 mg by mouth at bedtime and may repeat dose one time if needed. Take 0.5mg  in the morning and evening  and 1mg  at bedtime     Coenzyme Q10 (CO Q 10 PO) Take by mouth.     desvenlafaxine (PRISTIQ) 100 MG 24 hr tablet      famotidine (PEPCID) 20 MG tablet daily.     fish oil-omega-3 fatty acids 1000 MG capsule Take 3 g by mouth daily.      FLUoxetine (PROZAC) 20 MG capsule Take 1 capsule by mouth daily.   1   KETAMINE HCL IV Inject into the vein. Every 4 weeks  Dr. Lorri Frederick     NON Mervyn Gay Green drink with greens     Probiotic Product (PROBIOTIC DAILY) CAPS Take by mouth daily.     promethazine (PHENERGAN) 25 MG tablet as needed.      rizatriptan (MAXALT) 10 MG tablet as needed.     traZODone (DESYREL) 50 MG tablet Take 50 mg by mouth at bedtime.     TURMERIC PO Take by mouth daily.     MILK  THISTLE PO Take by mouth. (Patient not taking: Reported on 02/16/2021)     No current facility-administered medications on file prior to visit.    ALLERGIES: Dramamine ii [meclizine hcl], Erythromycin, Levofloxacin, Meclizine, Sulfa antibiotics, and Dimenhydrinate  Family History  Problem Relation Age of Onset   Colon cancer Father 6           Depression Father    Breast cancer Mother 79       her 2 neu+   Hypertension Mother    Heart Problems Mother        pacemaker placed   Heart disease Maternal Grandmother    Heart disease Paternal Grandmother    Breast cancer Maternal Aunt        maternal great aunt   Dementia Maternal Aunt     SH:  single, non smoker  Review of Systems  Constitutional: Negative.   Endo/Heme/Allergies: Negative.    PHYSICAL EXAMINATION:    BP 120/82 (BP Location: Right Arm, Patient Position: Sitting, Cuff Size: Normal)   Pulse 80   Ht 5' 4.5" (1.638 m)   Wt 171 lb (77.6 kg)   LMP 12/31/2008   BMI 28.90 kg/m     Physical Exam Constitutional:      Appearance: Normal appearance.  Chest:  Breasts:    Right: Normal. No swelling, bleeding, inverted nipple, mass, nipple discharge, skin change or tenderness.     Left: Normal. No swelling, bleeding, inverted nipple, mass, nipple discharge, skin change or tenderness.  Musculoskeletal:     Cervical back: Normal range of motion and neck supple. No tenderness.  Lymphadenopathy:     Cervical: No cervical adenopathy.     Upper Body:     Right upper body: No supraclavicular or axillary adenopathy.     Left upper body: No supraclavicular or axillary adenopathy.  Neurological:     Mental Status: She is alert.    Assessment/Plan: 1. Family history of breast cancer in first degree relative  2. Increased risk of breast cancer -planning diagnostic breast MRI in 07/2021.  Reminder placed for this.  Total time with pt and in documentation:  20 minutes

## 2021-07-03 ENCOUNTER — Other Ambulatory Visit (HOSPITAL_BASED_OUTPATIENT_CLINIC_OR_DEPARTMENT_OTHER): Payer: Self-pay | Admitting: Obstetrics & Gynecology

## 2021-07-03 DIAGNOSIS — Z803 Family history of malignant neoplasm of breast: Secondary | ICD-10-CM

## 2021-07-03 DIAGNOSIS — Z9189 Other specified personal risk factors, not elsewhere classified: Secondary | ICD-10-CM

## 2021-07-20 ENCOUNTER — Other Ambulatory Visit: Payer: Medicare Other

## 2021-08-09 ENCOUNTER — Ambulatory Visit
Admission: RE | Admit: 2021-08-09 | Discharge: 2021-08-09 | Disposition: A | Discharge status: Home / Self Care | Payer: Medicare PPO | Source: Ambulatory Visit | Attending: Obstetrics & Gynecology | Admitting: Obstetrics & Gynecology

## 2021-08-09 DIAGNOSIS — Z9189 Other specified personal risk factors, not elsewhere classified: Secondary | ICD-10-CM

## 2021-08-09 DIAGNOSIS — Z803 Family history of malignant neoplasm of breast: Secondary | ICD-10-CM

## 2021-08-09 MED ORDER — GADOBUTROL 1 MMOL/ML IV SOLN
7.0000 mL | Freq: Once | INTRAVENOUS | Status: AC | PRN
  Administered 2021-08-09: 7 mL via INTRAVENOUS

## 2021-08-11 ENCOUNTER — Encounter (HOSPITAL_BASED_OUTPATIENT_CLINIC_OR_DEPARTMENT_OTHER): Payer: Self-pay | Admitting: Obstetrics & Gynecology

## 2021-08-17 ENCOUNTER — Encounter (HOSPITAL_BASED_OUTPATIENT_CLINIC_OR_DEPARTMENT_OTHER): Payer: Self-pay | Admitting: Obstetrics & Gynecology

## 2021-08-17 ENCOUNTER — Ambulatory Visit (INDEPENDENT_AMBULATORY_CARE_PROVIDER_SITE_OTHER): Payer: Medicare PPO | Admitting: Obstetrics & Gynecology

## 2021-08-17 ENCOUNTER — Other Ambulatory Visit (HOSPITAL_COMMUNITY)
Admission: RE | Admit: 2021-08-17 | Discharge: 2021-08-17 | Disposition: A | Discharge status: Home / Self Care | Payer: Medicare PPO | Source: Ambulatory Visit | Attending: Obstetrics & Gynecology | Admitting: Obstetrics & Gynecology

## 2021-08-17 VITALS — BP 113/76 | HR 82 | Ht 64.5 in | Wt 165.6 lb

## 2021-08-17 DIAGNOSIS — Z01419 Encounter for gynecological examination (general) (routine) without abnormal findings: Secondary | ICD-10-CM | POA: Diagnosis not present

## 2021-08-17 DIAGNOSIS — Z803 Family history of malignant neoplasm of breast: Secondary | ICD-10-CM

## 2021-08-17 DIAGNOSIS — Z124 Encounter for screening for malignant neoplasm of cervix: Secondary | ICD-10-CM | POA: Insufficient documentation

## 2021-08-17 DIAGNOSIS — F332 Major depressive disorder, recurrent severe without psychotic features: Secondary | ICD-10-CM

## 2021-08-17 DIAGNOSIS — R922 Inconclusive mammogram: Secondary | ICD-10-CM

## 2021-08-17 DIAGNOSIS — L989 Disorder of the skin and subcutaneous tissue, unspecified: Secondary | ICD-10-CM | POA: Diagnosis not present

## 2021-08-17 DIAGNOSIS — Z780 Asymptomatic menopausal state: Secondary | ICD-10-CM | POA: Diagnosis not present

## 2021-08-17 DIAGNOSIS — M899 Disorder of bone, unspecified: Secondary | ICD-10-CM

## 2021-08-17 DIAGNOSIS — Z23 Encounter for immunization: Secondary | ICD-10-CM

## 2021-08-17 NOTE — Progress Notes (Signed)
66 y.o. G0P0 Single White or Caucasian female here for breast and pelvic exam.  I am also following her for being increased risks for breast cancer.  Just had breast MRI showing an enhancing sternal lesion.  Pt and I discussed today.  I have already reached out to Dr. Lindi Adie who recommended proceeding with bone scan.  Pt comfortable with plan to proceed.  Denies vaginal bleeding.  Patient's last menstrual period was 12/31/2008.          Sexually active: No.  H/O STD:  no  Health Maintenance: PCP:  Dr. Theodis Sato.  Last wellness appt was 12/2020.  Did blood work at that appt:   Vaccines are up to date:  had not started pneumonia vaccination Colonoscopy:  07/31/2017 MMG:  01/02/2021 Follow up 1 year BMD:  12/16/2019 Last pap smear:  08/15/2020 Normal.   H/o abnormal pap smear:  no    reports that she has never smoked. She has never used smokeless tobacco. She reports that she does not drink alcohol and does not use drugs.  Past Medical History:  Diagnosis Date   Anxiety    Basal cell carcinoma of face    forehead   CFS (chronic fatigue syndrome)    Chronic fatigue 08/01/2017   Chronic insomnia 01/20/2016   Common migraine with intractable migraine 01/20/2016   Dense breasts 04/15/2014   Depression    Diverticulosis 08/01/2017   Family history of breast cancer in first degree relative 04/15/2014   Fibromyalgia    Gallstones    Generalized anxiety disorder 08/31/2015   GERD (gastroesophageal reflux disease) 08/01/2017   Hematuria    neg eval Dr. Joelyn Oms, 2000   MDD (major depressive disorder), recurrent episode, severe ([place]) 04/08/2013   Migraine    Osteoporosis 10/05/2014   Renal cyst    Sciatica    bulging L5-S1   Tachycardia    negative stress test    Past Surgical History:  Procedure Laterality Date   MOHS SURGERY  9/08   Dr Eula Fried    Current Outpatient Medications  Medication Sig Dispense Refill   B Complex Vitamins (VITAMIN B COMPLEX PO) Take by mouth daily.     bisoprolol  (ZEBETA) 5 MG tablet Take 2.5 mg by mouth daily.   3   brexpiprazole (REXULTI) 1 MG TABS tablet Take by mouth.     buPROPion (WELLBUTRIN SR) 150 MG 12 hr tablet Taking '150mg'$  2x a day     clonazePAM (KLONOPIN) 1 MG tablet Take 0.5 mg by mouth at bedtime and may repeat dose one time if needed. Take 0.'5mg'$  in the morning and evening and '1mg'$  at bedtime     Coenzyme Q10 (CO Q 10 PO) Take by mouth.     desvenlafaxine (PRISTIQ) 100 MG 24 hr tablet      famotidine (PEPCID) 20 MG tablet daily.     fish oil-omega-3 fatty acids 1000 MG capsule Take 3 g by mouth daily.      FLUoxetine (PROZAC) 20 MG capsule Take 1 capsule by mouth daily.   1   KETAMINE HCL IV Inject into the vein. Every 4 weeks  Dr. Lorri Frederick     MILK THISTLE PO Take by mouth.     Multiple Vitamins-Minerals (ICAPS AREDS 2 PO) Take by mouth.     NON FORMULARY Green drink with greens     Probiotic Product (PROBIOTIC DAILY) CAPS Take by mouth daily.     promethazine (PHENERGAN) 25 MG tablet as needed.  rizatriptan (MAXALT) 10 MG tablet as needed.     traZODone (DESYREL) 50 MG tablet Take 50 mg by mouth at bedtime.     TURMERIC PO Take by mouth daily.     No current facility-administered medications for this visit.    Family History  Problem Relation Age of Onset   Colon cancer Father 51           Depression Father    Breast cancer Mother 68       her 2 neu+   Hypertension Mother    Heart Problems Mother        pacemaker placed   Heart disease Maternal Grandmother    Heart disease Paternal Grandmother    Breast cancer Maternal Aunt        maternal great aunt   Dementia Maternal Aunt     Review of Systems  Constitutional: Negative.   Genitourinary: Negative.    Exam:   BP 113/76 (BP Location: Right Arm, Patient Position: Sitting, Cuff Size: Normal)   Pulse 82   Ht 5' 4.5" (1.638 m) Comment: reported  Wt 165 lb 9.6 oz (75.1 kg)   LMP 12/31/2008   BMI 27.99 kg/m   Height: 5' 4.5" (163.8 cm) (reported)  General  appearance: alert, cooperative and appears stated age Breasts: normal appearance, no masses or tenderness Abdomen: soft, non-tender; bowel sounds normal; no masses,  no organomegaly Lymph nodes: Cervical, supraclavicular, and axillary nodes normal.  No abnormal inguinal nodes palpated Neurologic: Grossly normal  Pelvic: External genitalia:  no lesions              Urethra:  normal appearing urethra with no masses, tenderness or lesions              Bartholins and Skenes: normal                 Vagina: normal appearing vagina with atrophic changes and no discharge, no lesions              Cervix: no lesions              Pap taken: Yes.   Bimanual Exam:  Uterus:  normal size, contour, position, consistency, mobility, non-tender              Adnexa: normal adnexa and no mass, fullness, tenderness               Rectovaginal: Confirms               Anus:  normal sphincter tone, no lesions  Chaperone, Octaviano Batty, CMA, was present for exam.  Assessment/Plan: 1. Encntr for gyn exam (general) (routine) w/o abn findings - pap smear obtained today - MMG every year with screening MRI on 6 month alternating schedule - colonoscopy due next year - vaccines reviewed.  Will give tdap today. - lab work done with PCP  2. Postmenopausal - no HRT  3.  Cervical cancer screening - Cytology - PAP( [place])  4. Bone lesion - NM Bone Scan Whole Body; Future - Comprehensive metabolic panel  5. Dense breasts  6. Family history of breast cancer in first degree relative  7. Severe episode of recurrent major depressive disorder, without psychotic features ([place])

## 2021-08-18 LAB — COMPREHENSIVE METABOLIC PANEL
ALT: 24 IU/L (ref 0–32)
AST: 26 IU/L (ref 0–40)
Albumin/Globulin Ratio: 1.8 (ref 1.2–2.2)
Albumin: 4.6 g/dL (ref 3.8–4.8)
Alkaline Phosphatase: 61 IU/L (ref 44–121)
BUN/Creatinine Ratio: 21 (ref 12–28)
BUN: 20 mg/dL (ref 8–27)
Bilirubin Total: <0.2 mg/dL (ref 0.0–1.2)
CO2: 22 mmol/L (ref 20–29)
Calcium: 9.4 mg/dL (ref 8.7–10.3)
Chloride: 101 mmol/L (ref 96–106)
Creatinine, Ser: 0.96 mg/dL (ref 0.57–1.00)
Globulin, Total: 2.5 g/dL (ref 1.5–4.5)
Glucose: 89 mg/dL (ref 70–99)
Potassium: 4.4 mmol/L (ref 3.5–5.2)
Sodium: 139 mmol/L (ref 134–144)
Total Protein: 7.1 g/dL (ref 6.0–8.5)
eGFR: 66 mL/min/{1.73_m2} (ref >59)

## 2021-08-18 LAB — CYTOLOGY - PAP: Diagnosis: NEGATIVE

## 2021-08-31 ENCOUNTER — Telehealth (HOSPITAL_BASED_OUTPATIENT_CLINIC_OR_DEPARTMENT_OTHER): Payer: Self-pay | Admitting: *Deleted

## 2021-08-31 ENCOUNTER — Encounter (HOSPITAL_BASED_OUTPATIENT_CLINIC_OR_DEPARTMENT_OTHER): Payer: Self-pay | Admitting: *Deleted

## 2021-08-31 NOTE — Telephone Encounter (Signed)
DPR reviewed. LMOVM that bone scan has been scheduled for 09/11/21 '@0900'$  with arrival time of 0830 at [facility]. She is to then return at 1130 for the scan. Advised to call with any questions.

## 2021-09-11 ENCOUNTER — Encounter (HOSPITAL_COMMUNITY)
Admission: RE | Admit: 2021-09-11 | Discharge: 2021-09-11 | Disposition: A | Discharge status: Home / Self Care | Payer: Medicare PPO | Source: Ambulatory Visit | Attending: Obstetrics & Gynecology | Admitting: Obstetrics & Gynecology

## 2021-09-11 DIAGNOSIS — M899 Disorder of bone, unspecified: Secondary | ICD-10-CM | POA: Insufficient documentation

## 2021-09-11 MED ORDER — TECHNETIUM TC 99M MEDRONATE IV KIT
20.0000 | PACK | Freq: Once | INTRAVENOUS | Status: AC | PRN
  Administered 2021-09-11: 20 via INTRAVENOUS

## 2021-09-12 ENCOUNTER — Encounter (HOSPITAL_BASED_OUTPATIENT_CLINIC_OR_DEPARTMENT_OTHER): Payer: Self-pay | Admitting: Obstetrics & Gynecology

## 2021-09-14 ENCOUNTER — Encounter (HOSPITAL_BASED_OUTPATIENT_CLINIC_OR_DEPARTMENT_OTHER): Payer: Self-pay | Admitting: Obstetrics & Gynecology

## 2021-09-25 ENCOUNTER — Encounter (HOSPITAL_BASED_OUTPATIENT_CLINIC_OR_DEPARTMENT_OTHER): Payer: Self-pay | Admitting: Obstetrics & Gynecology

## 2021-09-26 ENCOUNTER — Other Ambulatory Visit (HOSPITAL_BASED_OUTPATIENT_CLINIC_OR_DEPARTMENT_OTHER): Payer: Self-pay | Admitting: Obstetrics & Gynecology

## 2021-09-26 ENCOUNTER — Other Ambulatory Visit (HOSPITAL_BASED_OUTPATIENT_CLINIC_OR_DEPARTMENT_OTHER): Payer: Self-pay | Admitting: *Deleted

## 2021-09-26 ENCOUNTER — Other Ambulatory Visit (HOSPITAL_BASED_OUTPATIENT_CLINIC_OR_DEPARTMENT_OTHER): Payer: Medicare PPO

## 2021-09-26 DIAGNOSIS — M899 Disorder of bone, unspecified: Secondary | ICD-10-CM

## 2021-09-27 ENCOUNTER — Other Ambulatory Visit (HOSPITAL_BASED_OUTPATIENT_CLINIC_OR_DEPARTMENT_OTHER): Payer: Self-pay | Admitting: Obstetrics & Gynecology

## 2021-09-27 DIAGNOSIS — M899 Disorder of bone, unspecified: Secondary | ICD-10-CM

## 2021-09-27 NOTE — Progress Notes (Signed)
c 

## 2021-09-28 ENCOUNTER — Encounter (HOSPITAL_BASED_OUTPATIENT_CLINIC_OR_DEPARTMENT_OTHER): Payer: Self-pay | Admitting: Obstetrics & Gynecology

## 2021-10-02 ENCOUNTER — Encounter (HOSPITAL_BASED_OUTPATIENT_CLINIC_OR_DEPARTMENT_OTHER): Payer: Self-pay | Admitting: Obstetrics & Gynecology

## 2021-10-02 LAB — PROTEIN ELECTROPHORESIS, SERUM
A/G Ratio: 1.4 (ref 0.7–1.7)
Albumin ELP: 4.2 g/dL (ref 2.9–4.4)
Alpha 1: 0.2 g/dL (ref 0.0–0.4)
Alpha 2: 0.7 g/dL (ref 0.4–1.0)
Beta: 1.2 g/dL (ref 0.7–1.3)
Gamma Globulin: 0.9 g/dL (ref 0.4–1.8)
Globulin, Total: 3.1 g/dL (ref 2.2–3.9)

## 2021-10-02 LAB — IMMUNOFIXATION ELECTROPHORESIS
IgA/Immunoglobulin A, Serum: 417 mg/dL — ABNORMAL HIGH (ref 87–352)
IgG (Immunoglobin G), Serum: 839 mg/dL (ref 586–1602)
IgM (Immunoglobulin M), Srm: 81 mg/dL (ref 26–217)
Total Protein: 7.3 g/dL (ref 6.0–8.5)

## 2021-10-04 ENCOUNTER — Other Ambulatory Visit (HOSPITAL_BASED_OUTPATIENT_CLINIC_OR_DEPARTMENT_OTHER): Payer: Medicare PPO

## 2021-10-16 ENCOUNTER — Ambulatory Visit
Admission: RE | Admit: 2021-10-16 | Discharge: 2021-10-16 | Disposition: A | Discharge status: Home / Self Care | Payer: Medicare PPO | Source: Ambulatory Visit | Attending: Obstetrics & Gynecology | Admitting: Obstetrics & Gynecology

## 2021-10-16 DIAGNOSIS — M899 Disorder of bone, unspecified: Secondary | ICD-10-CM

## 2021-10-16 MED ORDER — IOPAMIDOL (ISOVUE-300) INJECTION 61%
100.0000 mL | Freq: Once | INTRAVENOUS | Status: AC | PRN
  Administered 2021-10-16: 100 mL via INTRAVENOUS

## 2021-10-17 ENCOUNTER — Encounter (HOSPITAL_BASED_OUTPATIENT_CLINIC_OR_DEPARTMENT_OTHER): Payer: Self-pay | Admitting: Obstetrics & Gynecology

## 2021-10-18 ENCOUNTER — Telehealth: Payer: Self-pay | Admitting: Hematology and Oncology

## 2021-10-18 NOTE — Telephone Encounter (Signed)
Scheduled appt per 7/19 staff msg from [employer]. Pt is aware of appt date and time. Pt is aware to arrive 15 mins prior to appt time and to bring and updated insurance card. Pt is aware of appt location.

## 2021-10-19 ENCOUNTER — Telehealth (HOSPITAL_BASED_OUTPATIENT_CLINIC_OR_DEPARTMENT_OTHER): Payer: Self-pay | Admitting: Obstetrics & Gynecology

## 2021-10-19 NOTE — Telephone Encounter (Signed)
Called pt in response to [employer].  Has sternal lytic lesion initially noted on breast MRI.  Had bone scan, lab work and CT chest/abd/pelvis as recommended by Dr. Lindi Adie, hematology/oncology.  CT results reviewed.  Stable renal cyst noted.  Lytic sternal lesion seen which is what was noted on MRI.  No other bone lesion or lymphadenopathy seen.  No primary cancers seen.  Small hiatal hernia and umbilical hernias noted.  I reached out to Dr. Lindi Adie to review and he is going to see pt.  Appt has been made for pt on 7/25.  She sent mychart message as she is nervous.  Advised I do not know he is going to proceed with any additional testing or just follow her at this point.  Reminded pt lab work was normal and not other evidence for cancer seen on imaging.  She is aware this is outside of my knowledge base and I've been communicating with Dr. Lindi Adie along the way since her MRI results came back.  Therefore, it is reasonable for him to take over this care and her follow up.  Asked if there was anything I could do to help her anxiety over this.  States she appreciated the phone call.  Advised pt to call with any concerns/questions after appt early next week.

## 2021-10-23 ENCOUNTER — Inpatient Hospital Stay: Payer: Medicare PPO | Attending: Hematology and Oncology | Admitting: Hematology and Oncology

## 2021-10-23 ENCOUNTER — Other Ambulatory Visit: Payer: Self-pay

## 2021-10-23 DIAGNOSIS — D167 Benign neoplasm of ribs, sternum and clavicle: Secondary | ICD-10-CM | POA: Insufficient documentation

## 2021-10-23 DIAGNOSIS — Z803 Family history of malignant neoplasm of breast: Secondary | ICD-10-CM | POA: Insufficient documentation

## 2021-10-23 DIAGNOSIS — Z800 Family history of malignant neoplasm of digestive organs: Secondary | ICD-10-CM | POA: Diagnosis not present

## 2021-10-23 DIAGNOSIS — M899 Disorder of bone, unspecified: Secondary | ICD-10-CM | POA: Insufficient documentation
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
## 2021-10-23 NOTE — Assessment & Plan Note (Signed)
08/10/2021: Breast MRI: No MRI evidence of breast cancer.  Enhancing lesion left side of the sternum nonspecific 09/12/2021: Bone scan: Left anterior fifth sixth seventh rib stepladder fractures, faint focus of activity left sternal degenerative changes in her joints 10/16/2021: CT CAP: Small mixed lytic and sclerotic lesion with cortical destruction in the inferior sternum on the left.  Recommendation: Request interventional radiology to obtain a biopsy of the sternal lesion. There is no other evidence of disease to evaluate. Further management based upon the biopsy results.

## 2021-10-24 ENCOUNTER — Telehealth: Payer: Self-pay | Admitting: Hematology and Oncology

## 2021-10-24 NOTE — Telephone Encounter (Signed)
Scheduled appointment per 7/24 los. Patient is aware. 

## 2022-01-25 ENCOUNTER — Encounter (HOSPITAL_BASED_OUTPATIENT_CLINIC_OR_DEPARTMENT_OTHER): Payer: Self-pay | Admitting: Obstetrics & Gynecology

## 2022-02-01 ENCOUNTER — Encounter (HOSPITAL_BASED_OUTPATIENT_CLINIC_OR_DEPARTMENT_OTHER): Payer: Self-pay | Admitting: *Deleted

## 2022-02-15 ENCOUNTER — Ambulatory Visit (HOSPITAL_BASED_OUTPATIENT_CLINIC_OR_DEPARTMENT_OTHER): Payer: Medicare PPO | Admitting: Obstetrics & Gynecology

## 2022-02-19 ENCOUNTER — Other Ambulatory Visit (HOSPITAL_COMMUNITY)
Admission: RE | Admit: 2022-02-19 | Discharge: 2022-02-19 | Disposition: A | Discharge status: Home / Self Care | Payer: Medicare PPO | Source: Ambulatory Visit | Attending: Obstetrics & Gynecology | Admitting: Obstetrics & Gynecology

## 2022-02-19 ENCOUNTER — Encounter (HOSPITAL_BASED_OUTPATIENT_CLINIC_OR_DEPARTMENT_OTHER): Payer: Self-pay | Admitting: Obstetrics & Gynecology

## 2022-02-19 ENCOUNTER — Ambulatory Visit (HOSPITAL_BASED_OUTPATIENT_CLINIC_OR_DEPARTMENT_OTHER): Payer: Medicare PPO | Admitting: Obstetrics & Gynecology

## 2022-02-19 VITALS — BP 110/65 | HR 85 | Ht 64.5 in | Wt 155.0 lb

## 2022-02-19 DIAGNOSIS — N950 Postmenopausal bleeding: Secondary | ICD-10-CM | POA: Insufficient documentation

## 2022-02-19 NOTE — Progress Notes (Signed)
GYNECOLOGY  VISIT  CC:   PMP bleeding  HPI: 66 y.o. G0P0 Single White or Caucasian female here for complaint of PMP bleeding.  It was dark and lasted just a day.  She did not have any cramping with this.  Does worry about possible abnormal findings.  Recommended either proceeding with endometrial biopsy today or having an ultrasound to evaluate the endometrium first and then possible biopsy.  She desires to proceed with biopsy and also have the ultrasound.     Past Medical History:  Diagnosis Date   Anxiety    Basal cell carcinoma of face    forehead   CFS (chronic fatigue syndrome)    Chronic fatigue 08/01/2017   Chronic insomnia 01/20/2016   Common migraine with intractable migraine 01/20/2016   Dense breasts 04/15/2014   Depression    Diverticulosis 08/01/2017   Family history of breast cancer in first degree relative 04/15/2014   Fibromyalgia    Gallstones    Generalized anxiety disorder 08/31/2015   GERD (gastroesophageal reflux disease) 08/01/2017   Hematuria    neg eval Dr. Joelyn Oms, 2000   MDD (major depressive disorder), recurrent episode, severe ([place]) 04/08/2013   Migraine    Osteoporosis 10/05/2014   Renal cyst    Sciatica    bulging L5-S1   Tachycardia    negative stress test    MEDS:   Current Outpatient Medications on File Prior to Visit  Medication Sig Dispense Refill   B Complex Vitamins (VITAMIN B COMPLEX PO) Take by mouth daily.     bisoprolol (ZEBETA) 5 MG tablet Take 2.5 mg by mouth daily.   3   brexpiprazole (REXULTI) 1 MG TABS tablet Take by mouth.     buPROPion (WELLBUTRIN SR) 150 MG 12 hr tablet Taking '150mg'$  2x a day     clonazePAM (KLONOPIN) 1 MG tablet Take 0.5 mg by mouth at bedtime and may repeat dose one time if needed. Take 0.'5mg'$  in the morning and evening and '1mg'$  at bedtime     Coenzyme Q10 (CO Q 10 PO) Take by mouth.     desvenlafaxine (PRISTIQ) 100 MG 24 hr tablet      famotidine (PEPCID) 20 MG tablet daily.     fish oil-omega-3 fatty acids 1000 MG  capsule Take 3 g by mouth daily.      FLUoxetine (PROZAC) 20 MG capsule Take 1 capsule by mouth daily.   1   KETAMINE HCL IV Inject into the vein. Every 4 weeks  Dr. Lorri Frederick     MILK THISTLE PO Take by mouth.     Multiple Vitamins-Minerals (ICAPS AREDS 2 PO) Take by mouth.     NON FORMULARY Green drink with greens     Probiotic Product (PROBIOTIC DAILY) CAPS Take by mouth daily.     promethazine (PHENERGAN) 25 MG tablet as needed.      rizatriptan (MAXALT) 10 MG tablet as needed.     traZODone (DESYREL) 50 MG tablet Take 50 mg by mouth at bedtime.     TURMERIC PO Take by mouth daily.     No current facility-administered medications on file prior to visit.    ALLERGIES: Dramamine ii [meclizine hcl], Erythromycin, Levofloxacin, Meclizine, Sulfa antibiotics, and Dimenhydrinate  SH:  single, non smoker  Review of Systems  Constitutional: Negative.     PHYSICAL EXAMINATION:    BP 110/65 (BP Location: Right Arm, Patient Position: Sitting, Cuff Size: Normal)   Pulse 85   Ht 5' 4.5" (1.638 m)  Comment: Reported  Wt 155 lb (70.3 kg)   LMP 12/31/2008   BMI 26.19 kg/m     General appearance: alert, cooperative and appears stated age Lymph:  no inguinal LAD noted  Pelvic: External genitalia:  no lesions              Urethra:  normal appearing urethra with no masses, tenderness or lesions              Bartholins and Skenes: normal                 Vagina: normal appearing vagina with normal color and discharge, no lesions              Cervix: no lesions              Bimanual Exam:  Uterus:  normal size, contour, position, consistency, mobility, non-tender              Adnexa: no mass, fullness, tenderness  Endometrial biopsy recommended.  Discussed with patient.  Verbal and written consent obtained.   Procedure:  Speculum placed.  Cervix visualized and cleansed with betadine prep.  A single toothed tenaculum was applied to the anterior lip of the cervix.  Endometrial pipelle was  advanced through the cervix into the endometrial cavity without difficulty.  Pipelle passed to 5.5cm.  Suction applied and pipelle removed with good tissue sample obtained.  Tenculum removed.  No bleeding noted.  Patient tolerated procedure well.              Chaperone, Octaviano Batty, CMA, was present for exam.  Assessment/Plan: 1. Postmenopausal bleeding - US PELVIC COMPLETE WITH TRANSVAGINAL; Future - Surgical pathology( [place]/ [facility]) - results will be called to pt and

## 2022-02-21 ENCOUNTER — Encounter (HOSPITAL_BASED_OUTPATIENT_CLINIC_OR_DEPARTMENT_OTHER): Payer: Self-pay | Admitting: Obstetrics & Gynecology

## 2022-02-21 LAB — SURGICAL PATHOLOGY

## 2022-03-01 ENCOUNTER — Ambulatory Visit (HOSPITAL_BASED_OUTPATIENT_CLINIC_OR_DEPARTMENT_OTHER)
Admission: RE | Admit: 2022-03-01 | Discharge: 2022-03-01 | Disposition: A | Discharge status: Home / Self Care | Payer: Medicare PPO | Source: Ambulatory Visit | Attending: Obstetrics & Gynecology | Admitting: Obstetrics & Gynecology

## 2022-03-01 DIAGNOSIS — N950 Postmenopausal bleeding: Secondary | ICD-10-CM | POA: Diagnosis not present

## 2022-08-20 ENCOUNTER — Encounter (HOSPITAL_BASED_OUTPATIENT_CLINIC_OR_DEPARTMENT_OTHER): Payer: Self-pay | Admitting: Obstetrics & Gynecology

## 2022-08-20 ENCOUNTER — Other Ambulatory Visit (HOSPITAL_BASED_OUTPATIENT_CLINIC_OR_DEPARTMENT_OTHER): Payer: Self-pay | Admitting: Obstetrics & Gynecology

## 2022-08-20 DIAGNOSIS — Z803 Family history of malignant neoplasm of breast: Secondary | ICD-10-CM

## 2022-08-22 ENCOUNTER — Ambulatory Visit (HOSPITAL_BASED_OUTPATIENT_CLINIC_OR_DEPARTMENT_OTHER): Payer: Medicare PPO | Admitting: Obstetrics & Gynecology

## 2022-08-24 ENCOUNTER — Other Ambulatory Visit: Payer: Self-pay

## 2022-08-24 DIAGNOSIS — Z9189 Other specified personal risk factors, not elsewhere classified: Secondary | ICD-10-CM

## 2022-08-24 DIAGNOSIS — M899 Disorder of bone, unspecified: Secondary | ICD-10-CM

## 2022-08-24 NOTE — Progress Notes (Signed)
Pt called to find out if she would ned NM bone scan before MD visit 7/24. Per MD, NM bone scan ordered with expected date 7/7. Pt was provided number to central scheduling to call and schedule.

## 2022-09-11 ENCOUNTER — Ambulatory Visit (INDEPENDENT_AMBULATORY_CARE_PROVIDER_SITE_OTHER): Payer: Medicare PPO | Admitting: Obstetrics & Gynecology

## 2022-09-11 ENCOUNTER — Other Ambulatory Visit (HOSPITAL_COMMUNITY)
Admission: RE | Admit: 2022-09-11 | Discharge: 2022-09-11 | Disposition: A | Discharge status: Home / Self Care | Payer: Medicare PPO | Source: Ambulatory Visit | Attending: Obstetrics & Gynecology | Admitting: Obstetrics & Gynecology

## 2022-09-11 ENCOUNTER — Encounter (HOSPITAL_BASED_OUTPATIENT_CLINIC_OR_DEPARTMENT_OTHER): Payer: Self-pay | Admitting: Obstetrics & Gynecology

## 2022-09-11 VITALS — BP 119/75 | HR 79 | Ht 64.0 in | Wt 153.8 lb

## 2022-09-11 DIAGNOSIS — M899 Disorder of bone, unspecified: Secondary | ICD-10-CM

## 2022-09-11 DIAGNOSIS — Z01419 Encounter for gynecological examination (general) (routine) without abnormal findings: Secondary | ICD-10-CM

## 2022-09-11 DIAGNOSIS — R9230 Dense breasts, unspecified: Secondary | ICD-10-CM

## 2022-09-11 DIAGNOSIS — Z124 Encounter for screening for malignant neoplasm of cervix: Secondary | ICD-10-CM | POA: Insufficient documentation

## 2022-09-11 DIAGNOSIS — N950 Postmenopausal bleeding: Secondary | ICD-10-CM | POA: Diagnosis not present

## 2022-09-11 DIAGNOSIS — Z9189 Other specified personal risk factors, not elsewhere classified: Secondary | ICD-10-CM

## 2022-09-11 NOTE — Patient Instructions (Signed)
Allison Cruz at [facility] [address]

## 2022-09-11 NOTE — Progress Notes (Signed)
67 y.o. G0P0 Single White or Caucasian female here for breast and pelvic exam.  I am also following her for increased risks for breast cancer.  Has a colonoscopy scheduled in July with Dr. Loreta Ave.    Mother is not doing well.  She has short term memory issues.  She is also having issues with urinary incontinence.  They are needing a hospital bed.  It has been a very few stressful weeks.    She's had a few episodes of spotting that occurs just once and then stops. Has u/s and endometrial biopsy 01/2022.    Patient's last menstrual period was 12/31/2008.          Sexually active: No.  H/O STD:  no  Health Maintenance: PCP:  Velazquez*.  Last wellness appt was 05/2022.  Did blood work at that appt:   Vaccines are up to date:  yes Colonoscopy:  07/31/2017.  Scheduled for July.   MMG:  01/08/2022 Negative BMD:  12/16/2019 with osteopenia Last pap smear:  08/17/2021 Negative.   H/o abnormal pap smear:      reports that she has never smoked. She has never used smokeless tobacco. She reports that she does not drink alcohol and does not use drugs.  Past Medical History:  Diagnosis Date  . Anxiety   . Basal cell carcinoma of face    forehead  . CFS (chronic fatigue syndrome)   . Chronic fatigue 08/01/2017  . Chronic insomnia 01/20/2016  . Common migraine with intractable migraine 01/20/2016  . Dense breasts 04/15/2014  . Depression   . Diverticulosis 08/01/2017  . Family history of breast cancer in first degree relative 04/15/2014  . Fibromyalgia   . Gallstones   . Generalized anxiety disorder 08/31/2015  . GERD (gastroesophageal reflux disease) 08/01/2017  . Hematuria    neg eval Dr. Etta Grandchild, 2000  . MDD (major depressive disorder), recurrent episode, severe ([facility]) 04/08/2013  . Migraine   . Osteoporosis 10/05/2014  . Renal cyst   . Sciatica    bulging L5-S1  . Tachycardia    negative stress test    Past Surgical History:  Procedure Laterality Date  . MOHS SURGERY  9/08   Dr Deanna Artis     Current Outpatient Medications  Medication Sig Dispense Refill  . B Complex Vitamins (VITAMIN B COMPLEX PO) Take by mouth daily.    . bisoprolol (ZEBETA) 5 MG tablet Take 2.5 mg by mouth daily.   3  . brexpiprazole (REXULTI) 1 MG TABS tablet Take by mouth.    Marland Kitchen buPROPion (WELLBUTRIN SR) 150 MG 12 hr tablet Taking 150mg  2x a day    . clonazePAM (KLONOPIN) 1 MG tablet Take 0.5 mg by mouth at bedtime and may repeat dose one time if needed. Take 0.5mg  in the morning and evening and 1mg  at bedtime    . Coenzyme Q10 (CO Q 10 PO) Take by mouth.    . desvenlafaxine (PRISTIQ) 100 MG 24 hr tablet     . famotidine (PEPCID) 20 MG tablet daily.    . fish oil-omega-3 fatty acids 1000 MG capsule Take 3 g by mouth daily.     Marland Kitchen FLUoxetine (PROZAC) 20 MG capsule Take 1 capsule by mouth daily.   1  . KETAMINE HCL IV Inject into the vein. Every 4 weeks  Dr. Alexia Freestone    . MILK THISTLE PO Take by mouth.    . Multiple Vitamins-Minerals (ICAPS AREDS 2 PO) Take by mouth.    . NON FORMULARY  Green drink with greens    . Probiotic Product (PROBIOTIC DAILY) CAPS Take by mouth daily.    . promethazine (PHENERGAN) 25 MG tablet as needed.     . rizatriptan (MAXALT) 10 MG tablet as needed.    . traZODone (DESYREL) 50 MG tablet Take 50 mg by mouth at bedtime.    . TURMERIC PO Take by mouth daily.     No current facility-administered medications for this visit.    Family History  Problem Relation Age of Onset  . Colon cancer Father 42          . Depression Father   . Breast cancer Mother 86       her 2 neu+  . Hypertension Mother   . Heart Problems Mother        pacemaker placed  . Heart disease Maternal Grandmother   . Heart disease Paternal Grandmother   . Breast cancer Maternal Aunt        maternal great aunt  . Dementia Maternal Aunt     Review of Systems  Exam:   LMP 12/31/2008      General appearance: alert, cooperative and appears stated age Breasts: normal appearance, no masses or  tenderness Abdomen: soft, non-tender; bowel sounds normal; no masses,  no organomegaly Lymph nodes: Cervical, supraclavicular, and axillary nodes normal.  No abnormal inguinal nodes palpated Neurologic: Grossly normal  Pelvic: External genitalia:  no lesions              Urethra:  normal appearing urethra with no masses, tenderness or lesions              Bartholins and Skenes: normal                 Vagina: normal appearing vagina with atrophic changes ***and no discharge, no lesions              Cervix: {exam; cervix:14595}              Pap taken: {yes no:314532} Bimanual Exam:  Uterus:  {exam; uterus:12215}              Adnexa: {exam; adnexa:12223}               Rectovaginal: Confirms               Anus:  normal sphincter tone, no lesions  Chaperone, ***, CMA, was present for exam.  Assessment/Plan: There are no diagnoses linked to this encounter.

## 2022-09-13 LAB — CYTOLOGY - PAP: Diagnosis: NEGATIVE

## 2022-10-02 ENCOUNTER — Ambulatory Visit
Admission: RE | Admit: 2022-10-02 | Discharge: 2022-10-02 | Disposition: A | Discharge status: Home / Self Care | Payer: Medicare PPO | Source: Ambulatory Visit | Attending: Obstetrics & Gynecology | Admitting: Obstetrics & Gynecology

## 2022-10-02 DIAGNOSIS — Z803 Family history of malignant neoplasm of breast: Secondary | ICD-10-CM

## 2022-10-02 MED ORDER — GADOPICLENOL 0.5 MMOL/ML IV SOLN
7.5000 mL | Freq: Once | INTRAVENOUS | Status: AC | PRN
  Administered 2022-10-02: 7.5 mL via INTRAVENOUS

## 2022-10-10 ENCOUNTER — Encounter (HOSPITAL_COMMUNITY)
Admission: RE | Admit: 2022-10-10 | Discharge: 2022-10-10 | Disposition: A | Discharge status: Home / Self Care | Payer: Medicare PPO | Source: Ambulatory Visit | Attending: Hematology and Oncology | Admitting: Hematology and Oncology

## 2022-10-10 ENCOUNTER — Encounter (HOSPITAL_COMMUNITY): Admission: RE | Admit: 2022-10-10 | Payer: Medicare PPO | Source: Ambulatory Visit

## 2022-10-10 DIAGNOSIS — M899 Disorder of bone, unspecified: Secondary | ICD-10-CM | POA: Insufficient documentation

## 2022-10-10 DIAGNOSIS — Z9189 Other specified personal risk factors, not elsewhere classified: Secondary | ICD-10-CM | POA: Insufficient documentation

## 2022-10-10 MED ORDER — TECHNETIUM TC 99M MEDRONATE IV KIT
21.7000 | PACK | Freq: Once | INTRAVENOUS | Status: AC | PRN
Start: 2022-10-10 — End: 2022-10-10
  Administered 2022-10-10: 21.7 via INTRAVENOUS

## 2022-10-12 ENCOUNTER — Telehealth: Payer: Self-pay | Admitting: Hematology and Oncology

## 2022-10-12 NOTE — Telephone Encounter (Signed)
Rescheduled appointment per provider [facility]. Patient is aware of the changes made to her upcoming appointment. 

## 2022-10-24 ENCOUNTER — Ambulatory Visit: Payer: Medicare PPO | Admitting: Hematology and Oncology

## 2022-10-24 NOTE — Progress Notes (Signed)
Patient Care Team: Cheron Schaumann., MD as PCP - General (Internal Medicine) Patient, No Pcp Per (General Practice) Jerene Bears, MD as Consulting Physician (Gynecology)  DIAGNOSIS:  Encounter Diagnosis  Name Primary?   Lesion of sternum present on bone scan Yes     CHIEF COMPLIANT: Follow-up of scans  INTERVAL HISTORY: Allison Cruz is a 67 y.o. female is here because of recent diagnosis of lesion on the sternum. She presents to the clinic for a follow-up to discuss scans. Pt denies any pain in the back area. Pt states that she suffers bad with depression and anxiety. She came in today really anxious.   ALLERGIES:  is allergic to dramamine ii [meclizine hcl], erythromycin, levofloxacin, meclizine, sulfa antibiotics, and dimenhydrinate.  MEDICATIONS:  Current Outpatient Medications  Medication Sig Dispense Refill   B Complex Vitamins (VITAMIN B COMPLEX PO) Take by mouth daily.     bisoprolol (ZEBETA) 5 MG tablet Take 2.5 mg by mouth daily.   3   brexpiprazole (REXULTI) 1 MG TABS tablet Take by mouth.     buPROPion (WELLBUTRIN SR) 150 MG 12 hr tablet Taking 150mg  2x a day     clonazePAM (KLONOPIN) 1 MG tablet Take 0.5 mg by mouth at bedtime and may repeat dose one time if needed. Take 0.5mg  in the morning and evening and 1mg  at bedtime     Coenzyme Q10 (CO Q 10 PO) Take by mouth.     desvenlafaxine (PRISTIQ) 100 MG 24 hr tablet      fish oil-omega-3 fatty acids 1000 MG capsule Take 3 g by mouth daily.      FLUoxetine (PROZAC) 20 MG capsule Take 1 capsule by mouth daily.   1   KETAMINE HCL IV Inject into the vein. Every 4 weeks  Dr. Alexia Freestone     MILK THISTLE PO Take by mouth.     Multiple Vitamins-Minerals (ICAPS AREDS 2 PO) Take by mouth.     NON FORMULARY Green drink with greens     Probiotic Product (PROBIOTIC DAILY) CAPS Take by mouth daily.     promethazine (PHENERGAN) 25 MG tablet as needed.      rizatriptan (MAXALT) 10 MG tablet as needed.     traZODone  (DESYREL) 50 MG tablet Take 50 mg by mouth at bedtime.     TURMERIC PO Take by mouth daily.     No current facility-administered medications for this visit.    PHYSICAL EXAMINATION: ECOG PERFORMANCE STATUS: 1 - Symptomatic but completely ambulatory  Vitals:   10/26/22 1154  BP: 114/71  Pulse: 96  Resp: 18  Temp: (!) 97.3 F (36.3 C)  SpO2: 96%   Filed Weights   10/26/22 1154  Weight: 151 lb 3.2 oz (68.6 kg)      LABORATORY DATA:  I have reviewed the data as listed    Latest Ref Rng & Units 09/26/2021   10:47 AM 08/17/2021    2:49 PM 10/08/2012    2:37 PM  CMP  Glucose 70 - 99 mg/dL  89  91   BUN 8 - 27 mg/dL  20  17   Creatinine 1.61 - 1.00 mg/dL  0.96  0.45   Sodium 409 - 144 mmol/L  139  135   Potassium 3.5 - 5.2 mmol/L  4.4  4.5   Chloride 96 - 106 mmol/L  101  100   CO2 20 - 29 mmol/L  22  26   Calcium 8.7 - 10.3 mg/dL  9.4  9.2   Total Protein 6.0 - 8.5 g/dL 7.3  7.1    Total Bilirubin 0.0 - 1.2 mg/dL  <4.5    Alkaline Phos 44 - 121 IU/L  61    AST 0 - 40 IU/L  26    ALT 0 - 32 IU/L  24      No results found for: "WBC", "HGB", "HCT", "MCV", "PLT", "NEUTROABS"  ASSESSMENT & PLAN:  Lesion of sternum present on bone scan 08/10/2021: Breast MRI: No MRI evidence of breast cancer.  Enhancing lesion left side of the sternum nonspecific 09/12/2021: Bone scan: Left anterior fifth sixth seventh rib stepladder fractures, faint focus of activity left sternal degenerative changes in her joints 10/16/2021: CT CAP: Small mixed lytic and sclerotic lesion with cortical destruction in the inferior sternum on the left. 10/02/22: No MRI evidence of malignancy. Enhancing lesion sternum not significantly changed 10/15/22: New uptake L1 (compression fracture) Deg change L5, resolution of uptake in ribs (prior fracture)  Recommendation: MRI lumbar spine will be ordered Follow-up 1 week after that to discuss results.  If it is suspicious for cancer then we might have to obtain a  biopsy. Osteoporosis: I suspect that the L1 lesion is an osteoporotic fracture.  For the treatment of osteoporosis she would benefit from either Reclast or Prolia.  Orders Placed This Encounter  Procedures   MR Lumbar Spine W Contrast    Standing Status:   Future    Standing Expiration Date:   10/26/2023    Order Specific Question:   If indicated for the ordered procedure, I authorize the administration of contrast media per Radiology protocol    Answer:   Yes    Order Specific Question:   What is the patient's sedation requirement?    Answer:   Anti-anxiety    Order Specific Question:   Does the patient have a pacemaker or implanted devices?    Answer:   No    Order Specific Question:   Preferred imaging location?    Answer:   [facility] (table limit-550lbs)    Order Specific Question:   Release to patient    Answer:   Immediate   The patient has a good understanding of the overall plan. she agrees with it. she will call with any problems that may develop before the next visit here. Total time spent: 30 mins including face to face time and time spent for planning, charting and co-ordination of care   Tamsen Meek, MD 10/26/22    I Janan Ridge am acting as a Neurosurgeon for [employer]  I have reviewed the above documentation for accuracy and completeness, and I agree with the above.

## 2022-10-26 ENCOUNTER — Other Ambulatory Visit: Payer: Self-pay

## 2022-10-26 ENCOUNTER — Inpatient Hospital Stay: Payer: Medicare PPO | Admitting: Hematology and Oncology

## 2022-10-26 VITALS — BP 114/71 | HR 96 | Temp 97.3°F | Resp 18 | Ht 64.0 in | Wt 151.2 lb

## 2022-10-26 DIAGNOSIS — M899 Disorder of bone, unspecified: Secondary | ICD-10-CM

## 2022-10-26 DIAGNOSIS — M810 Age-related osteoporosis without current pathological fracture: Secondary | ICD-10-CM | POA: Diagnosis not present

## 2022-10-26 MED ORDER — LORAZEPAM 1 MG PO TABS: 1.0000 mg | ORAL_TABLET | Freq: Three times a day (TID) | ORAL | 0 refills | Status: DC

## 2022-10-26 NOTE — Assessment & Plan Note (Signed)
08/10/2021: Breast MRI: No MRI evidence of breast cancer.  Enhancing lesion left side of the sternum nonspecific 09/12/2021: Bone scan: Left anterior fifth sixth seventh rib stepladder fractures, faint focus of activity left sternal degenerative changes in her joints 10/16/2021: CT CAP: Small mixed lytic and sclerotic lesion with cortical destruction in the inferior sternum on the left. 10/02/22: No MRI evidence of malignancy. Enhancing lesion sternum not significantly changed 10/15/22: New uptake L1 (compression fracture) Deg change L5, resolution of uptake in ribs (prior fracture)  Recommendation: Continue to watch and monitor with annual scans

## 2022-10-26 NOTE — Addendum Note (Signed)
Addended by: Serena Croissant on: 10/26/2022 02:24 PM   Modules accepted: Orders

## 2022-10-29 ENCOUNTER — Telehealth: Payer: Self-pay | Admitting: Hematology and Oncology

## 2022-10-29 NOTE — Telephone Encounter (Signed)
Scheduled appointment per 7/26 los. Patient is aware of the made appointment.

## 2022-11-06 ENCOUNTER — Encounter: Payer: Self-pay | Admitting: Hematology and Oncology

## 2022-11-08 ENCOUNTER — Telehealth: Payer: Self-pay

## 2022-11-08 NOTE — Telephone Encounter (Signed)
Returned Pt's call regarding ativan rx instructions. Pt calling to clarify when she is to take rx prior to MRI for claustrophobia. Advised Pt to take 1 tab an hour prior to scan and the second tab once she arrives to appointment. Pt verbalized understanding.

## 2022-11-11 ENCOUNTER — Ambulatory Visit: Admission: RE | Admit: 2022-11-11 | Payer: Medicare PPO | Source: Ambulatory Visit

## 2022-11-11 DIAGNOSIS — M899 Disorder of bone, unspecified: Secondary | ICD-10-CM

## 2022-11-11 MED ORDER — GADOPICLENOL 0.5 MMOL/ML IV SOLN
7.0000 mL | Freq: Once | INTRAVENOUS | Status: AC | PRN
  Administered 2022-11-11: 7 mL via INTRAVENOUS

## 2022-11-16 ENCOUNTER — Inpatient Hospital Stay: Payer: Medicare PPO | Attending: Hematology and Oncology | Admitting: Hematology and Oncology

## 2022-11-16 DIAGNOSIS — M899 Disorder of bone, unspecified: Secondary | ICD-10-CM

## 2022-11-16 NOTE — Assessment & Plan Note (Signed)
08/10/2021: Breast MRI: No MRI evidence of breast cancer.  Enhancing lesion left side of the sternum nonspecific 09/12/2021: Bone scan: Left anterior fifth sixth seventh rib stepladder fractures, faint focus of activity left sternal degenerative changes in her joints 10/16/2021: CT CAP: Small mixed lytic and sclerotic lesion with cortical destruction in the inferior sternum on the left. 10/02/22: No MRI evidence of malignancy. Enhancing lesion sternum not significantly changed 10/15/22: New uptake L1 (compression fracture) Deg change L5, resolution of uptake in ribs (prior fracture)   11/15/2022: MRI lumbar spine: Rounded superior endplate deformity L1 suggestive of subacute fracture or Schmorl's node.  L5-S1 degenerative changes.  I discussed with the patient that these changes are benign and no further biopsy or treatment is warranted. Osteoporosis: I suspect that the L1 lesion is an osteoporotic fracture.  For the treatment of osteoporosis she would benefit from either Reclast or Prolia.  Return to clinic on an as-needed basis.

## 2022-11-16 NOTE — Progress Notes (Signed)
HEMATOLOGY-ONCOLOGY TELEPHONE VISIT PROGRESS NOTE  I connected with our patient on 11/16/22 at 12:00 PM EDT by telephone and verified that I am speaking with the correct person using two identifiers.  I discussed the limitations, risks, security and privacy concerns of performing an evaluation and management service by telephone and the availability of in person appointments.  I also discussed with the patient that there may be a patient responsible charge related to this service. The patient expressed understanding and agreed to proceed.   History of Present Illness: Allison Cruz is a 67 y.o. female is here because of prior diagnosis of lesion on the sternum as well as the spine. She presents to the clinic for a  telephone follow-up to discuss the results of the MRI of the spine.  REVIEW OF SYSTEMS:   Constitutional: Denies fevers, chills or abnormal weight loss All other systems were reviewed with the patient and are negative. Observations/Objective:    Assessment Plan:  Lesion of sternum present on bone scan 08/10/2021: Breast MRI: No MRI evidence of breast cancer.  Enhancing lesion left side of the sternum nonspecific 09/12/2021: Bone scan: Left anterior fifth sixth seventh rib stepladder fractures, faint focus of activity left sternal degenerative changes in her joints 10/16/2021: CT CAP: Small mixed lytic and sclerotic lesion with cortical destruction in the inferior sternum on the left. 10/02/22: No MRI evidence of malignancy. Enhancing lesion sternum not significantly changed 10/15/22: New uptake L1 (compression fracture) Deg change L5, resolution of uptake in ribs (prior fracture)   11/15/2022: MRI lumbar spine: Rounded superior endplate deformity L1 suggestive of subacute fracture or Schmorl's node.  L5-S1 degenerative changes.  I discussed with the patient that these changes are benign and no further biopsy or treatment is warranted. Osteoporosis: I suspect that the L1 lesion is an  osteoporotic fracture.  For the treatment of osteoporosis she would benefit from either Reclast or Prolia.  She has not had a bone density test since 2021.  She inform me that she will discuss with Dr. Hyacinth Meeker and get it set up.  I suspect that she will likely show worsening osteoporosis.   She will continue her annual breast MRIs.  I can call her with the results of these MRIs. Return to clinic on an as-needed basis.   I discussed the assessment and treatment plan with the patient. The patient was provided an opportunity to ask questions and all were answered. The patient agreed with the plan and demonstrated an understanding of the instructions. The patient was advised to call back or seek an in-person evaluation if the symptoms worsen or if the condition fails to improve as anticipated.   I provided 12 minutes of non-face-to-face time during this encounter.  This includes time for charting and coordination of care   Tamsen Meek, MD  I Janan Ridge am acting as a scribe for Dr.Damire Remedios  I have reviewed the above documentation for accuracy and completeness, and I agree with the above.

## 2022-11-21 ENCOUNTER — Ambulatory Visit (HOSPITAL_BASED_OUTPATIENT_CLINIC_OR_DEPARTMENT_OTHER): Payer: Medicare PPO | Admitting: Obstetrics & Gynecology

## 2022-11-21 ENCOUNTER — Ambulatory Visit (HOSPITAL_BASED_OUTPATIENT_CLINIC_OR_DEPARTMENT_OTHER): Payer: Medicare PPO

## 2022-11-21 ENCOUNTER — Encounter (HOSPITAL_BASED_OUTPATIENT_CLINIC_OR_DEPARTMENT_OTHER): Payer: Self-pay | Admitting: Obstetrics & Gynecology

## 2022-11-21 ENCOUNTER — Other Ambulatory Visit (HOSPITAL_BASED_OUTPATIENT_CLINIC_OR_DEPARTMENT_OTHER): Payer: Self-pay | Admitting: Obstetrics & Gynecology

## 2022-11-21 VITALS — BP 112/80 | HR 89 | Ht 64.0 in | Wt 150.4 lb

## 2022-11-21 DIAGNOSIS — M8588 Other specified disorders of bone density and structure, other site: Secondary | ICD-10-CM

## 2022-11-21 DIAGNOSIS — N950 Postmenopausal bleeding: Secondary | ICD-10-CM

## 2022-11-21 NOTE — Progress Notes (Unsigned)
    Plan to do exam and test urine micro for blood

## 2022-11-25 NOTE — Progress Notes (Signed)
GYNECOLOGY  VISIT  CC:   PMP spotting  HPI: 67 y.o. G0P0 Single White or Caucasian female here for ultrasound due to concerns about PMP spotting.  This has been an ongoing concern for her.  She did have an endometrial biopsy 01/2022 with extremely scanty endometrium and multiple passes were obtained.  Ultrasound today showed uterus 3.5 x 2.2 x 1.6cm.  Endometrium 1.79mm.  Do not feel repeat biopsy needed.   D/w pt I would like to do an exam when she sees the spotting/bleeding next time to investigate more thoroughly regarding the bleeding.  If it really is present at the cervical os, hysteroscopy would be indicated.    She is having lots of stressors with her mother who has developed dementia .  Very sad for pt to watch her mother deteriorate.     Past Medical History:  Diagnosis Date   Anxiety    Basal cell carcinoma of face    forehead   CFS (chronic fatigue syndrome)    Chronic fatigue 08/01/2017   Chronic insomnia 01/20/2016   Common migraine with intractable migraine 01/20/2016   Dense breasts 04/15/2014   Depression    Diverticulosis 08/01/2017   Family history of breast cancer in first degree relative 04/15/2014   Fibromyalgia    Gallstones    Generalized anxiety disorder 08/31/2015   GERD (gastroesophageal reflux disease) 08/01/2017   Hematuria    neg eval Dr. Etta Grandchild, 2000   MDD (major depressive disorder), recurrent episode, severe (HCC) 04/08/2013   Migraine    Osteoporosis 10/05/2014   Renal cyst    Sciatica    bulging L5-S1   Tachycardia    negative stress test    MEDS:   Current Outpatient Medications on File Prior to Visit  Medication Sig Dispense Refill   B Complex Vitamins (VITAMIN B COMPLEX PO) Take by mouth daily.     bisoprolol (ZEBETA) 5 MG tablet Take 2.5 mg by mouth daily.   3   brexpiprazole (REXULTI) 1 MG TABS tablet Take by mouth.     clonazePAM (KLONOPIN) 1 MG tablet Take 0.5 mg by mouth at bedtime and may repeat dose one time if needed. Take 0.5mg  in the  morning and evening and 1mg  at bedtime     Coenzyme Q10 (CO Q 10 PO) Take by mouth.     desvenlafaxine (PRISTIQ) 100 MG 24 hr tablet      fish oil-omega-3 fatty acids 1000 MG capsule Take 3 g by mouth daily.      FLUoxetine (PROZAC) 20 MG capsule Take 1 capsule by mouth daily.   1   KETAMINE HCL IV Inject into the vein. Every 4 weeks  Dr. Alexia Freestone     LORazepam (ATIVAN) 1 MG tablet Take 1 tablet (1 mg total) by mouth every 8 (eight) hours. Pre med for MRI 2 tablet 0   MILK THISTLE PO Take by mouth.     Multiple Vitamins-Minerals (ICAPS AREDS 2 PO) Take by mouth.     NON FORMULARY Green drink with greens     Probiotic Product (PROBIOTIC DAILY) CAPS Take by mouth daily.     promethazine (PHENERGAN) 25 MG tablet as needed.      rizatriptan (MAXALT) 10 MG tablet as needed.     traZODone (DESYREL) 50 MG tablet Take 50 mg by mouth at bedtime.     TURMERIC PO Take by mouth daily.     No current facility-administered medications on file prior to visit.    ALLERGIES:  Dramamine ii [meclizine hcl], Erythromycin, Levofloxacin, Meclizine, Sulfa antibiotics, and Dimenhydrinate  SH:  single, non smoker  Review of Systems  Constitutional: Negative.   Genitourinary: Negative.     PHYSICAL EXAMINATION:    BP 112/80 (BP Location: Left Arm, Patient Position: Sitting, Cuff Size: Normal)   Pulse 89   Ht 5\' 4"  (1.626 m)   Wt 150 lb 6.4 oz (68.2 kg)   LMP 12/31/2008   BMI 25.82 kg/m     Physical Exam Constitutional:      Appearance: Normal appearance.  Neurological:     General: No focal deficit present.     Mental Status: She is alert.  Psychiatric:        Mood and Affect: Mood normal.        Behavior: Behavior normal.     Assessment/Plan: 1. Postmenopausal bleeding - do not feel biopsy needed today.  Endometrium is 1.64mm.  Will plan exam when pt has next episode of bleeding.  Same or next day exam would be preferred.  She voices understanding.

## 2023-01-17 ENCOUNTER — Encounter (HOSPITAL_BASED_OUTPATIENT_CLINIC_OR_DEPARTMENT_OTHER): Payer: Self-pay | Admitting: *Deleted

## 2023-01-17 ENCOUNTER — Other Ambulatory Visit (HOSPITAL_BASED_OUTPATIENT_CLINIC_OR_DEPARTMENT_OTHER): Payer: Self-pay | Admitting: *Deleted

## 2023-01-17 DIAGNOSIS — M8588 Other specified disorders of bone density and structure, other site: Secondary | ICD-10-CM

## 2023-01-31 ENCOUNTER — Encounter (HOSPITAL_BASED_OUTPATIENT_CLINIC_OR_DEPARTMENT_OTHER): Payer: Self-pay | Admitting: Obstetrics & Gynecology

## 2023-02-15 NOTE — Telephone Encounter (Signed)
Pt has already been informed of results and appt scheduled.

## 2023-03-13 ENCOUNTER — Ambulatory Visit (HOSPITAL_BASED_OUTPATIENT_CLINIC_OR_DEPARTMENT_OTHER): Payer: Medicare PPO | Admitting: Obstetrics & Gynecology

## 2023-03-13 ENCOUNTER — Other Ambulatory Visit (HOSPITAL_COMMUNITY)
Admission: RE | Admit: 2023-03-13 | Discharge: 2023-03-13 | Disposition: A | Discharge status: Home / Self Care | Payer: Medicare PPO | Source: Ambulatory Visit | Attending: Obstetrics & Gynecology | Admitting: Obstetrics & Gynecology

## 2023-03-13 ENCOUNTER — Encounter (HOSPITAL_BASED_OUTPATIENT_CLINIC_OR_DEPARTMENT_OTHER): Payer: Self-pay | Admitting: Obstetrics & Gynecology

## 2023-03-13 VITALS — BP 99/72 | HR 72 | Ht 64.0 in | Wt 156.4 lb

## 2023-03-13 DIAGNOSIS — R300 Dysuria: Secondary | ICD-10-CM

## 2023-03-13 DIAGNOSIS — N950 Postmenopausal bleeding: Secondary | ICD-10-CM | POA: Diagnosis not present

## 2023-03-13 DIAGNOSIS — M810 Age-related osteoporosis without current pathological fracture: Secondary | ICD-10-CM

## 2023-03-13 NOTE — Patient Instructions (Signed)
Calcium 1200 - 1500  Oscal or caltrate.  Try to add 600mg .

## 2023-03-13 NOTE — Progress Notes (Signed)
Subjective:    67 yrs Single Caucasian G0P0  female here to discuss recent BMD obtained 01/04/2023 showing osteoporosis.    In left femoral neck with -2.8.  this has changed since her prior BMD.  We discussed treatment   Unrelated, she had some brown spotting over the weekend.  She also felt there was some urethral irritation and she felt uncomfortable.  She has taken some OTC cranberry tablets and this is better.  She would like to leave a urine sample today.  Osteoporosis Risk Factors  Personal Hx of fracture as an adult: no Tobacco use: no Low body weight (<127 lbs): no Estrogen deficiency with either early menopause (age <45) or bilateral ovariectomy: no Alcohol use more than 2 drinks per day: no Recurrent falls: no Inadequate physical activity: yes  Current calcium and Vit D intake:taking Vit D and MVI.  Calcium in diet discussed.    Past Medical History:  Diagnosis Date   Anxiety    Basal cell carcinoma of face    forehead   CFS (chronic fatigue syndrome)    Chronic fatigue 08/01/2017   Chronic insomnia 01/20/2016   Common migraine with intractable migraine 01/20/2016   Dense breasts 04/15/2014   Depression    Diverticulosis 08/01/2017   Family history of breast cancer in first degree relative 04/15/2014   Fibromyalgia    Gallstones    Generalized anxiety disorder 08/31/2015   GERD (gastroesophageal reflux disease) 08/01/2017   Hematuria    neg eval Dr. Etta Grandchild, 2000   MDD (major depressive disorder), recurrent episode, severe (HCC) 04/08/2013   Migraine    Osteoporosis 10/05/2014   Renal cyst    Sciatica    bulging L5-S1   Tachycardia    negative stress test   Current Outpatient Medications on File Prior to Visit  Medication Sig Dispense Refill   B Complex Vitamins (VITAMIN B COMPLEX PO) Take by mouth daily.     bisoprolol (ZEBETA) 5 MG tablet Take 2.5 mg by mouth daily.   3   brexpiprazole (REXULTI) 1 MG TABS tablet Take by mouth.     clonazePAM (KLONOPIN) 1 MG tablet Take  0.5 mg by mouth at bedtime and may repeat dose one time if needed. Take 0.5mg  in the morning and evening and 1mg  at bedtime     Coenzyme Q10 (CO Q 10 PO) Take by mouth.     desvenlafaxine (PRISTIQ) 100 MG 24 hr tablet      fish oil-omega-3 fatty acids 1000 MG capsule Take 3 g by mouth daily.      FLUoxetine (PROZAC) 20 MG capsule Take 1 capsule by mouth daily.   1   KETAMINE HCL IV Inject into the vein. Every 4 weeks  Dr. Alexia Freestone     LORazepam (ATIVAN) 1 MG tablet Take 1 tablet (1 mg total) by mouth every 8 (eight) hours. Pre med for MRI 2 tablet 0   MILK THISTLE PO Take by mouth.     Multiple Vitamins-Minerals (ICAPS AREDS 2 PO) Take by mouth.     NON FORMULARY Green drink with greens     Probiotic Product (PROBIOTIC DAILY) CAPS Take by mouth daily.     promethazine (PHENERGAN) 25 MG tablet as needed.      rizatriptan (MAXALT) 10 MG tablet as needed.     traZODone (DESYREL) 50 MG tablet Take 50 mg by mouth at bedtime.     TURMERIC PO Take by mouth daily.     No current facility-administered medications on  file prior to visit.   Allergies  Allergen Reactions   Dramamine Ii [Meclizine Hcl]    Erythromycin    Levofloxacin     Other reaction(s): GI Upset (intolerance)   Meclizine Hives   Sulfa Antibiotics    Dimenhydrinate Rash    Review of Systems Pertinent items noted in HPI and remainder of comprehensive ROS otherwise negative.     Objective:   PHYSICAL EXAM BP 99/72 (BP Location: Right Arm, Patient Position: Sitting, Cuff Size: Normal)   Pulse 72   Ht 5\' 4"  (1.626 m)   Wt 156 lb 6.4 oz (70.9 kg)   LMP 12/31/2008   BMI 26.85 kg/m  Physical Exam Constitutional:      Appearance: Normal appearance.  Abdominal:     Hernia: There is no hernia in the left inguinal area or right inguinal area.  Genitourinary:    Labia:        Right: No lesion.        Left: No lesion.      Vagina: Normal.     Cervix: Normal.     Uterus: Normal.      Adnexa: Right adnexa normal and  left adnexa normal.  Neurological:     General: No focal deficit present.     Mental Status: She is alert.  Psychiatric:        Mood and Affect: Mood normal.    Endometrial biopsy recommended.  Discussed with patient.  Verbal and written consent obtained.   Procedure:  Speculum placed.  Cervix visualized and cleansed with betadine prep.  A single toothed tenaculum was applied to the anterior lip of the cervix.  Endometrial pipelle was advanced through the cervix into the endometrial cavity without difficulty.  Pipelle passed to 6cm.  Suction applied and pipelle removed with good tissue sample obtained.  Tenculum removed.  No bleeding noted.  Patient tolerated procedure well.  Imaging Bone Density: Spine T Score:  -2.7, Hip T Score: -2.8 done 01/17/2023.  Hip and spine measurement are both decreased from prior BMD.  .                                         Assessment:   Osteoporosis with T score -2.8 PMP bleeding Urinary urgency   Plan:   1.  Treatment options with medications discussed including:   Bisphosphonates po and IV  Evist  Prolia subcutaneous   Forteo subcutaneous  Risks, benefits of medications, mechanism of action discussed.  Specifically atypical fractures and osteonecrosis discussed.  2.  Patient counseled in adequate calcium and vitamin D.  Recommended she add 600mg  calcium with Vit D  3.  30 minutes of weight bearing exercise at least 3 times weekly recommended.  4.  Fall prevention discussed.  5.  Lab work ordered:  PTH with intact calcium.  6.  Endometrial biopsy obtained today  7.  Urine culture pending

## 2023-03-14 LAB — PTH, INTACT AND CALCIUM
Calcium: 9.2 mg/dL (ref 8.7–10.3)
PTH: 32 pg/mL (ref 15–65)

## 2023-03-15 LAB — SURGICAL PATHOLOGY

## 2023-03-15 LAB — URINE CULTURE

## 2023-03-20 ENCOUNTER — Ambulatory Visit (HOSPITAL_BASED_OUTPATIENT_CLINIC_OR_DEPARTMENT_OTHER): Payer: Medicare PPO | Admitting: Obstetrics & Gynecology

## 2023-03-20 ENCOUNTER — Ambulatory Visit (HOSPITAL_BASED_OUTPATIENT_CLINIC_OR_DEPARTMENT_OTHER): Payer: Medicare PPO

## 2023-03-20 ENCOUNTER — Encounter (HOSPITAL_BASED_OUTPATIENT_CLINIC_OR_DEPARTMENT_OTHER): Payer: Self-pay | Admitting: Obstetrics & Gynecology

## 2023-03-20 VITALS — BP 97/63 | HR 74 | Ht 64.0 in | Wt 157.6 lb

## 2023-03-20 DIAGNOSIS — Z8669 Personal history of other diseases of the nervous system and sense organs: Secondary | ICD-10-CM

## 2023-03-20 DIAGNOSIS — N950 Postmenopausal bleeding: Secondary | ICD-10-CM | POA: Diagnosis not present

## 2023-03-20 MED ORDER — PROMETHAZINE HCL 25 MG PO TABS: 25.0000 mg | ORAL_TABLET | Freq: Three times a day (TID) | ORAL | 1 refills | Status: AC | PRN

## 2023-03-23 NOTE — Progress Notes (Signed)
GYNECOLOGY  VISIT  CC:   Discuss ultrasound results, h/o PMP bleeding  HPI: 67 y.o. G0P0 Single White or Caucasian female here for discussion of ultrasound results.  U/S done due to PMP bleeding.  She did have a negative endometrial biopsy obtained 03/13/2023.  Reviewed with pt today.    U/S showed small uterus with thin endometrium.  Ovaries atrophic.  Pt reassured.  However, this has happened multipe times.  Feel hysteroscopy is reasonable next step.  Discussed today including risks which include but at not limited to uterine perforation, need for additional surgery, injury to other structures, abnormal pathology.   Past Medical History:  Diagnosis Date   Anxiety    Basal cell carcinoma of face    forehead   CFS (chronic fatigue syndrome)    Chronic fatigue 08/01/2017   Chronic insomnia 01/20/2016   Common migraine with intractable migraine 01/20/2016   Dense breasts 04/15/2014   Depression    Diverticulosis 08/01/2017   Family history of breast cancer in first degree relative 04/15/2014   Fibromyalgia    Gallstones    Generalized anxiety disorder 08/31/2015   GERD (gastroesophageal reflux disease) 08/01/2017   Hematuria    neg eval Dr. Etta Grandchild, 2000   MDD (major depressive disorder), recurrent episode, severe (HCC) 04/08/2013   Migraine    Osteoporosis 10/05/2014   Renal cyst    Sciatica    bulging L5-S1   Tachycardia    negative stress test    MEDS:   Current Outpatient Medications on File Prior to Visit  Medication Sig Dispense Refill   B Complex Vitamins (VITAMIN B COMPLEX PO) Take by mouth daily.     bisoprolol (ZEBETA) 5 MG tablet Take 2.5 mg by mouth daily.   3   brexpiprazole (REXULTI) 1 MG TABS tablet Take by mouth.     clonazePAM (KLONOPIN) 1 MG tablet Take 0.5 mg by mouth at bedtime and may repeat dose one time if needed. Take 0.5mg  in the morning and evening and 1mg  at bedtime     Coenzyme Q10 (CO Q 10 PO) Take by mouth.     desvenlafaxine (PRISTIQ) 100 MG 24 hr tablet       fish oil-omega-3 fatty acids 1000 MG capsule Take 3 g by mouth daily.      FLUoxetine (PROZAC) 20 MG capsule Take 1 capsule by mouth daily.   1   KETAMINE HCL IV Inject into the vein. Every 4 weeks  Dr. Alexia Freestone     LORazepam (ATIVAN) 1 MG tablet Take 1 tablet (1 mg total) by mouth every 8 (eight) hours. Pre med for MRI 2 tablet 0   MILK THISTLE PO Take by mouth.     Multiple Vitamins-Minerals (ICAPS AREDS 2 PO) Take by mouth.     NON FORMULARY Green drink with greens     Probiotic Product (PROBIOTIC DAILY) CAPS Take by mouth daily.     rizatriptan (MAXALT) 10 MG tablet as needed.     traZODone (DESYREL) 50 MG tablet Take 50 mg by mouth at bedtime.     TURMERIC PO Take by mouth daily.     No current facility-administered medications on file prior to visit.    ALLERGIES: Dramamine ii [meclizine hcl], Erythromycin, Levofloxacin, Meclizine, Sulfa antibiotics, and Dimenhydrinate  SH:  single, non somker  Review of Systems  Constitutional: Negative.   Genitourinary: Negative.     PHYSICAL EXAMINATION:    BP 97/63 (BP Location: Right Arm, Patient Position: Sitting, Cuff Size: Large)  Pulse 74   Ht 5\' 4"  (1.626 m)   Wt 157 lb 9.6 oz (71.5 kg)   LMP 12/31/2008   BMI 27.05 kg/m      Physical Exam Constitutional:      Appearance: Normal appearance.  Neurological:     General: No focal deficit present.  Psychiatric:        Mood and Affect: Mood normal.     Assessment/Plan: 1. Postmenopausal bleeding (Primary) - ultrasound reassuring today.  Negative endometrial biopsy.  Will consider hysteroscopy if happens this again.   2. Hx of migraines - pt requests RF for phenergan.  Fel reasonable to fill today. - promethazine (PHENERGAN) 25 MG tablet; Take 1 tablet (25 mg total) by mouth every 8 (eight) hours as needed.  Dispense: 30 tablet; Refill: 1   Total time with pt: 26 minutes Documentation time; 6 mintues Total time:  32 minutes

## 2023-04-10 ENCOUNTER — Other Ambulatory Visit (HOSPITAL_BASED_OUTPATIENT_CLINIC_OR_DEPARTMENT_OTHER): Payer: Medicare PPO

## 2023-04-10 ENCOUNTER — Ambulatory Visit (HOSPITAL_BASED_OUTPATIENT_CLINIC_OR_DEPARTMENT_OTHER): Payer: Medicare PPO | Admitting: Obstetrics & Gynecology

## 2023-04-15 ENCOUNTER — Encounter (HOSPITAL_BASED_OUTPATIENT_CLINIC_OR_DEPARTMENT_OTHER): Payer: Self-pay | Admitting: Obstetrics & Gynecology

## 2023-04-16 ENCOUNTER — Other Ambulatory Visit (HOSPITAL_BASED_OUTPATIENT_CLINIC_OR_DEPARTMENT_OTHER): Payer: Self-pay | Admitting: Obstetrics & Gynecology

## 2023-04-16 DIAGNOSIS — N950 Postmenopausal bleeding: Secondary | ICD-10-CM

## 2023-04-26 ENCOUNTER — Encounter (HOSPITAL_BASED_OUTPATIENT_CLINIC_OR_DEPARTMENT_OTHER): Payer: Self-pay | Admitting: Obstetrics & Gynecology

## 2023-04-29 ENCOUNTER — Other Ambulatory Visit (HOSPITAL_BASED_OUTPATIENT_CLINIC_OR_DEPARTMENT_OTHER): Payer: Self-pay | Admitting: Obstetrics & Gynecology

## 2023-04-29 DIAGNOSIS — Z01812 Encounter for preprocedural laboratory examination: Secondary | ICD-10-CM

## 2023-04-29 DIAGNOSIS — N950 Postmenopausal bleeding: Secondary | ICD-10-CM

## 2023-04-30 ENCOUNTER — Encounter (HOSPITAL_BASED_OUTPATIENT_CLINIC_OR_DEPARTMENT_OTHER): Payer: Self-pay | Admitting: Obstetrics & Gynecology

## 2023-04-30 ENCOUNTER — Encounter (HOSPITAL_BASED_OUTPATIENT_CLINIC_OR_DEPARTMENT_OTHER): Payer: Self-pay

## 2023-06-06 ENCOUNTER — Encounter (HOSPITAL_COMMUNITY): Payer: Self-pay | Admitting: Obstetrics & Gynecology

## 2023-06-06 NOTE — Progress Notes (Signed)
 Spoke w/ via phone for pre-op interview--- Junious Dresser Lab needs dos----    CBC per surgeon. BMP and EKG per anesthesia.     Lab results------ COVID test -----patient states asymptomatic no test needed Arrive at -------1200 NPO after MN NO Solid Food.  Clear liquids from MN until---1100 Pre-Surgery Ensure or G2:  Med rec completed Medications to take morning of surgery -----Bisoprolol, Prozac and Klonopin. Maxalt and Phenergan PRN Diabetic medication -----  GLP1 agonist last dose: GLP1 instructions:  Patient instructed no nail polish to be worn day of surgery Patient instructed to bring photo id and insurance card day of surgery Patient aware to have Driver (ride ) / caregiver    for 24 hours after surgery - Niece Helane Rima Patient Special Instructions -----Shower with antibacterial soap. Pre-Op special Instructions -----  Patient verbalized understanding of instructions that were given at this phone interview. Patient denies chest pain, sob, fever, cough at the interview.

## 2023-06-10 NOTE — Progress Notes (Signed)
 Spoke with pt regarding arrival time change. Pt to arrive at 0830, clear liquids until 0730, No solids after midnight. Pt verbalized understanding and states no further questions.

## 2023-06-10 NOTE — Anesthesia Preprocedure Evaluation (Signed)
 Anesthesia Evaluation  Patient identified by MRN, date of birth, ID band Patient awake    Reviewed: Allergy & Precautions, NPO status , Patient's Chart, lab work & pertinent test results, reviewed documented beta blocker date and time   History of Anesthesia Complications (+) PONV and history of anesthetic complications  Airway Mallampati: II  TM Distance: >3 FB Neck ROM: Full    Dental no notable dental hx.    Pulmonary neg pulmonary ROS   Pulmonary exam normal        Cardiovascular hypertension, Pt. on medications and Pt. on home beta blockers Normal cardiovascular exam     Neuro/Psych  Headaches  Anxiety Depression       GI/Hepatic Neg liver ROS,GERD  Controlled,,  Endo/Other  negative endocrine ROS    Renal/GU Renal InsufficiencyRenal disease  negative genitourinary   Musculoskeletal  (+)  Fibromyalgia -  Abdominal   Peds  Hematology negative hematology ROS (+)   Anesthesia Other Findings Day of surgery medications reviewed with patient.  Reproductive/Obstetrics PMB                             Anesthesia Physical Anesthesia Plan  ASA: 2  Anesthesia Plan: General   Post-op Pain Management: Tylenol PO (pre-op)*   Induction: Intravenous  PONV Risk Score and Plan: 4 or greater and Midazolam, Treatment may vary due to age or medical condition, Dexamethasone, Ondansetron, Promethazine, Propofol infusion and TIVA  Airway Management Planned: LMA  Additional Equipment: None  Intra-op Plan:   Post-operative Plan: Extubation in OR  Informed Consent: I have reviewed the patients History and Physical, chart, labs and discussed the procedure including the risks, benefits and alternatives for the proposed anesthesia with the patient or authorized representative who has indicated his/her understanding and acceptance.     Dental advisory given  Plan Discussed with: CRNA  Anesthesia  Plan Comments: (Phenergan 12.5mg  IV intra-op per patient request. Stephannie Peters, MD)       Anesthesia Quick Evaluation

## 2023-06-11 ENCOUNTER — Ambulatory Visit (HOSPITAL_BASED_OUTPATIENT_CLINIC_OR_DEPARTMENT_OTHER): Payer: Self-pay | Admitting: Anesthesiology

## 2023-06-11 ENCOUNTER — Other Ambulatory Visit (HOSPITAL_BASED_OUTPATIENT_CLINIC_OR_DEPARTMENT_OTHER): Payer: Self-pay | Admitting: Obstetrics & Gynecology

## 2023-06-11 ENCOUNTER — Other Ambulatory Visit (HOSPITAL_COMMUNITY): Payer: Self-pay

## 2023-06-11 ENCOUNTER — Ambulatory Visit (HOSPITAL_COMMUNITY)
Admission: RE | Admit: 2023-06-11 | Discharge: 2023-06-11 | Disposition: A | Discharge status: Home / Self Care | Payer: Medicare PPO | Attending: Obstetrics & Gynecology | Admitting: Obstetrics & Gynecology

## 2023-06-11 ENCOUNTER — Ambulatory Visit (HOSPITAL_COMMUNITY): Payer: Self-pay | Admitting: Anesthesiology

## 2023-06-11 ENCOUNTER — Encounter (HOSPITAL_COMMUNITY)
Admission: RE | Disposition: A | Discharge status: Home / Self Care | Payer: Self-pay | Source: Home / Self Care | Attending: Obstetrics & Gynecology

## 2023-06-11 ENCOUNTER — Encounter (HOSPITAL_COMMUNITY): Payer: Self-pay | Admitting: Obstetrics & Gynecology

## 2023-06-11 ENCOUNTER — Other Ambulatory Visit: Payer: Self-pay

## 2023-06-11 DIAGNOSIS — K219 Gastro-esophageal reflux disease without esophagitis: Secondary | ICD-10-CM | POA: Diagnosis not present

## 2023-06-11 DIAGNOSIS — M797 Fibromyalgia: Secondary | ICD-10-CM | POA: Diagnosis not present

## 2023-06-11 DIAGNOSIS — Z79899 Other long term (current) drug therapy: Secondary | ICD-10-CM | POA: Diagnosis not present

## 2023-06-11 DIAGNOSIS — I10 Essential (primary) hypertension: Secondary | ICD-10-CM | POA: Insufficient documentation

## 2023-06-11 DIAGNOSIS — N950 Postmenopausal bleeding: Secondary | ICD-10-CM | POA: Diagnosis present

## 2023-06-11 DIAGNOSIS — F32A Depression, unspecified: Secondary | ICD-10-CM | POA: Insufficient documentation

## 2023-06-11 DIAGNOSIS — N840 Polyp of corpus uteri: Secondary | ICD-10-CM | POA: Diagnosis not present

## 2023-06-11 DIAGNOSIS — F419 Anxiety disorder, unspecified: Secondary | ICD-10-CM | POA: Insufficient documentation

## 2023-06-11 DIAGNOSIS — Z01812 Encounter for preprocedural laboratory examination: Secondary | ICD-10-CM

## 2023-06-11 HISTORY — DX: Essential (primary) hypertension: I10

## 2023-06-11 HISTORY — DX: Other specified postprocedural states: Z98.890

## 2023-06-11 HISTORY — PX: DILATATION & CURETTAGE/HYSTEROSCOPY WITH MYOSURE: SHX6511

## 2023-06-11 LAB — BASIC METABOLIC PANEL
Anion gap: 8 (ref 5–15)
BUN: 16 mg/dL (ref 8–23)
CO2: 23 mmol/L (ref 22–32)
Calcium: 9 mg/dL (ref 8.9–10.3)
Chloride: 107 mmol/L (ref 98–111)
Creatinine, Ser: 0.92 mg/dL (ref 0.44–1.00)
GFR, Estimated: >60 mL/min (ref >60)
Glucose, Bld: 97 mg/dL (ref 70–99)
Potassium: 4.2 mmol/L (ref 3.5–5.1)
Sodium: 138 mmol/L (ref 135–145)

## 2023-06-11 LAB — CBC
HCT: 37.7 % (ref 36.0–46.0)
Hemoglobin: 13 g/dL (ref 12.0–15.0)
MCH: 32.7 pg (ref 26.0–34.0)
MCHC: 34.5 g/dL (ref 30.0–36.0)
MCV: 94.7 fL (ref 80.0–100.0)
Platelets: 268 10*3/uL (ref 150–400)
RBC: 3.98 MIL/uL (ref 3.87–5.11)
RDW: 12.7 % (ref 11.5–15.5)
WBC: 7.6 10*3/uL (ref 4.0–10.5)
nRBC: 0 % (ref 0.0–0.2)

## 2023-06-11 SURGERY — DILATATION & CURETTAGE/HYSTEROSCOPY WITH MYOSURE
Anesthesia: General

## 2023-06-11 MED ORDER — PROPOFOL 10 MG/ML IV BOLUS
INTRAVENOUS | Status: DC | PRN
  Administered 2023-06-11: 150 ug/kg/min via INTRAVENOUS
  Administered 2023-06-11: 150 mg via INTRAVENOUS

## 2023-06-11 MED ORDER — EPHEDRINE SULFATE-NACL 50-0.9 MG/10ML-% IV SOSY
PREFILLED_SYRINGE | INTRAVENOUS | Status: DC | PRN
Start: 2023-06-11 — End: 2023-06-11
  Administered 2023-06-11 (×2): 10 mg via INTRAVENOUS
  Administered 2023-06-11: 15 mg via INTRAVENOUS

## 2023-06-11 MED ORDER — DEXAMETHASONE SODIUM PHOSPHATE 10 MG/ML IJ SOLN
INTRAMUSCULAR | Status: DC | PRN
  Administered 2023-06-11: 10 mg via INTRAVENOUS

## 2023-06-11 MED ORDER — LIDOCAINE-EPINEPHRINE 1 %-1:100000 IJ SOLN
INTRAMUSCULAR | Status: AC
  Filled 2023-06-11: qty 1

## 2023-06-11 MED ORDER — FENTANYL CITRATE (PF) 100 MCG/2ML IJ SOLN
25.0000 ug | INTRAMUSCULAR | Status: DC | PRN
  Administered 2023-06-11: 50 ug via INTRAVENOUS

## 2023-06-11 MED ORDER — OXYCODONE HCL 5 MG/5ML PO SOLN: 5.0000 mg | Freq: Once | ORAL | Status: DC | PRN

## 2023-06-11 MED ORDER — LIDOCAINE-EPINEPHRINE 1 %-1:100000 IJ SOLN
INTRAMUSCULAR | Status: DC | PRN
  Administered 2023-06-11: 10 mL

## 2023-06-11 MED ORDER — SODIUM CHLORIDE 0.9 % IR SOLN
Status: DC | PRN
  Administered 2023-06-11: 3000 mL

## 2023-06-11 MED ORDER — OXYCODONE HCL 5 MG PO TABS: 5.0000 mg | ORAL_TABLET | Freq: Once | ORAL | Status: DC | PRN

## 2023-06-11 MED ORDER — FENTANYL CITRATE (PF) 250 MCG/5ML IJ SOLN
INTRAMUSCULAR | Status: AC
  Filled 2023-06-11: qty 5

## 2023-06-11 MED ORDER — SODIUM CHLORIDE 0.9 % IV SOLN
12.5000 mg | Freq: Once | INTRAVENOUS | Status: AC
  Administered 2023-06-11: 12.5 mg via INTRAVENOUS
  Filled 2023-06-11: qty 0.5

## 2023-06-11 MED ORDER — LIDOCAINE 2% (20 MG/ML) 5 ML SYRINGE
INTRAMUSCULAR | Status: DC | PRN
  Administered 2023-06-11: 100 mg via INTRAVENOUS

## 2023-06-11 MED ORDER — ONDANSETRON HCL 4 MG/2ML IJ SOLN
INTRAMUSCULAR | Status: AC
  Filled 2023-06-11: qty 2

## 2023-06-11 MED ORDER — PROMETHAZINE HCL 25 MG/ML IJ SOLN
INTRAMUSCULAR | Status: AC
  Filled 2023-06-11: qty 1

## 2023-06-11 MED ORDER — EPHEDRINE 5 MG/ML INJ
INTRAVENOUS | Status: AC
  Filled 2023-06-11: qty 5

## 2023-06-11 MED ORDER — MIDAZOLAM HCL 2 MG/2ML IJ SOLN
INTRAMUSCULAR | Status: AC
  Filled 2023-06-11: qty 2

## 2023-06-11 MED ORDER — ONDANSETRON HCL 4 MG/2ML IJ SOLN
INTRAMUSCULAR | Status: DC | PRN
  Administered 2023-06-11: 4 mg via INTRAVENOUS

## 2023-06-11 MED ORDER — HYDROCODONE-ACETAMINOPHEN 5-325 MG PO TABS
1.0000 | ORAL_TABLET | Freq: Four times a day (QID) | ORAL | 0 refills | Status: AC | PRN
  Filled 2023-06-11: qty 12, 2d supply, fill #0

## 2023-06-11 MED ORDER — CHLORHEXIDINE GLUCONATE 0.12 % MT SOLN
15.0000 mL | Freq: Once | OROMUCOSAL | Status: AC
  Administered 2023-06-11: 15 mL via OROMUCOSAL

## 2023-06-11 MED ORDER — LACTATED RINGERS IV SOLN: INTRAVENOUS | Status: DC

## 2023-06-11 MED ORDER — POVIDONE-IODINE 10 % EX SWAB: 2.0000 | Freq: Once | CUTANEOUS | Status: DC

## 2023-06-11 MED ORDER — PROPOFOL 1000 MG/100ML IV EMUL
INTRAVENOUS | Status: AC
  Filled 2023-06-11: qty 100

## 2023-06-11 MED ORDER — PHENYLEPHRINE 80 MCG/ML (10ML) SYRINGE FOR IV PUSH (FOR BLOOD PRESSURE SUPPORT)
PREFILLED_SYRINGE | INTRAVENOUS | Status: AC
  Filled 2023-06-11: qty 10

## 2023-06-11 MED ORDER — PROPOFOL 10 MG/ML IV BOLUS
INTRAVENOUS | Status: AC
  Filled 2023-06-11: qty 20

## 2023-06-11 MED ORDER — DEXAMETHASONE SODIUM PHOSPHATE 10 MG/ML IJ SOLN
INTRAMUSCULAR | Status: AC
  Filled 2023-06-11: qty 1

## 2023-06-11 MED ORDER — ROCURONIUM BROMIDE 10 MG/ML (PF) SYRINGE
PREFILLED_SYRINGE | INTRAVENOUS | Status: AC
  Filled 2023-06-11: qty 10

## 2023-06-11 MED ORDER — LIDOCAINE 2% (20 MG/ML) 5 ML SYRINGE
INTRAMUSCULAR | Status: AC
  Filled 2023-06-11: qty 5

## 2023-06-11 MED ORDER — ACETAMINOPHEN 500 MG PO TABS
ORAL_TABLET | ORAL | Status: AC
  Filled 2023-06-11: qty 2

## 2023-06-11 MED ORDER — DROPERIDOL 2.5 MG/ML IJ SOLN: 0.6250 mg | Freq: Once | INTRAMUSCULAR | Status: DC | PRN

## 2023-06-11 MED ORDER — ORAL CARE MOUTH RINSE: 15.0000 mL | Freq: Once | OROMUCOSAL | Status: AC

## 2023-06-11 MED ORDER — PHENYLEPHRINE HCL-NACL 20-0.9 MG/250ML-% IV SOLN
INTRAVENOUS | Status: DC | PRN
  Administered 2023-06-11 (×2): 100 ug via INTRAVENOUS

## 2023-06-11 MED ORDER — CHLORHEXIDINE GLUCONATE 0.12 % MT SOLN
OROMUCOSAL | Status: AC
  Filled 2023-06-11: qty 15

## 2023-06-11 MED ORDER — MIDAZOLAM HCL 2 MG/2ML IJ SOLN
INTRAMUSCULAR | Status: DC | PRN
  Administered 2023-06-11: 2 mg via INTRAVENOUS

## 2023-06-11 MED ORDER — ACETAMINOPHEN 500 MG PO TABS
1000.0000 mg | ORAL_TABLET | ORAL | Status: AC
  Administered 2023-06-11: 1000 mg via ORAL

## 2023-06-11 SURGICAL SUPPLY — 17 items
CANISTER SUCT 3000ML PPV (MISCELLANEOUS) ×1 IMPLANT
CATH ROBINSON RED A/P 16FR (CATHETERS) ×1 IMPLANT
CURETTE PIPELLE ENDOMTRL SUCTN (MISCELLANEOUS) IMPLANT
DEVICE MYOSURE LITE (MISCELLANEOUS) IMPLANT
DEVICE MYOSURE REACH (MISCELLANEOUS) IMPLANT
DILATOR CANAL MILEX (MISCELLANEOUS) IMPLANT
GLOVE ECLIPSE 6.5 STRL STRAW (GLOVE) ×1 IMPLANT
GLOVE SURG UNDER POLY LF SZ7 (GLOVE) ×2 IMPLANT
GOWN STRL REUS W/ TWL LRG LVL3 (GOWN DISPOSABLE) ×2 IMPLANT
KIT PROCEDURE FLUENT (KITS) ×1 IMPLANT
KIT TURNOVER KIT B (KITS) ×1 IMPLANT
PACK VAGINAL MINOR WOMEN LF (CUSTOM PROCEDURE TRAY) ×1 IMPLANT
PAD OB MATERNITY 11 LF (PERSONAL CARE ITEMS) ×1 IMPLANT
PIPELLE ENDOMETRIAL SUCTION CU (MISCELLANEOUS) ×1 IMPLANT
SEAL ROD LENS SCOPE MYOSURE (ABLATOR) ×1 IMPLANT
TOWEL GREEN STERILE FF (TOWEL DISPOSABLE) ×2 IMPLANT
UNDERPAD 30X36 HEAVY ABSORB (UNDERPADS AND DIAPERS) ×1 IMPLANT

## 2023-06-11 NOTE — Progress Notes (Signed)
 Pt answered yes to having had thoughts of harming herself in the last month. Upon investigation, pt declines thoughts of suicide or wanting to end life. Works closely with therapist and received ketamine therapy 06/07/23 and adjusted home meds. States has not had any thoughts of harming self for 2 weeks. Pt became tearful and states that mother is on hospice. States she would never "leave her".

## 2023-06-11 NOTE — Discharge Instructions (Addendum)
 DO NOT TAKE TYLENOL UNTIL AFTER 3:15pm.    Post Anesthesia Home Care Instructions  Activity: Get plenty of rest for the remainder of the day. A responsible adult should stay with you for 24 hours following the procedure.  For the next 24 hours, DO NOT: -Drive a car -Advertising copywriter -Drink alcoholic beverages -Take any medication unless instructed by your physician -Make any legal decisions or sign important papers.  Meals: Start with liquid foods such as gelatin or soup. Progress to regular foods as tolerated. Avoid greasy, spicy, heavy foods. If nausea and/or vomiting occur, drink only clear liquids until the nausea and/or vomiting subsides. Call your physician if vomiting continues.  Special Instructions/Symptoms: Your throat may feel dry or sore from the anesthesia or the breathing tube placed in your throat during surgery. If this causes discomfort, gargle with warm salt water. The discomfort should disappear within 24 hours.  Call your surgeon if you experience:   1.  Fever over 101.0. 2.  Inability to urinate. 3.  Nausea and/or vomiting. 4.  Extreme swelling or bruising at the surgical site. 5.  Continued bleeding from the incision. 6.  Increased pain, redness or drainage from the incision. 7.  Problems related to your pain medication. 8. Any change in color, movement and/or sensation 9. Any problems and/or concerns

## 2023-06-11 NOTE — Anesthesia Procedure Notes (Signed)
 Procedure Name: LMA Insertion Date/Time: 06/11/2023 12:32 PM  Performed by: [address], Anshika Pethtel A, CRNAPre-anesthesia Checklist: Patient identified, Emergency Drugs available, Suction available, Patient being monitored and Timeout performed Patient Re-evaluated:Patient Re-evaluated prior to induction Oxygen Delivery Method: Circle system utilized Preoxygenation: Pre-oxygenation with 100% oxygen Induction Type: IV induction LMA: LMA inserted LMA Size: 4.0 Number of attempts: 1

## 2023-06-11 NOTE — Anesthesia Postprocedure Evaluation (Signed)
 Anesthesia Post Note  Patient: Allison Cruz  Procedure(s) Performed: DILATATION & CURETTAGE/HYSTEROSCOPY WITH MYOSURE POSSIBLE POLYP REMOVAL     Anesthesia Type: General Anesthetic complications: no   No notable events documented.  Last Vitals:  Vitals:   06/11/23 0901 06/11/23 1330  BP: 104/70 (!) 96/56  Pulse: 74 77  Resp: 15 (!) 22  Temp: 36.8 C 36.4 C  SpO2: 96% 97%    Last Pain:  Vitals:   06/11/23 1330  TempSrc:   PainSc: 6                  [name] A [address]

## 2023-06-11 NOTE — Op Note (Signed)
 06/11/2023  1:52 PM  PATIENT:  Allison Cruz  68 y.o. female  PRE-OPERATIVE DIAGNOSIS:  postmenopausal bleeding Polyp  POST-OPERATIVE DIAGNOSIS:  postmenopausal bleedingPolyp  PROCEDURE:  Procedure(s): DILATATION & CURETTAGE/HYSTEROSCOPY WITH MYOSURE POSSIBLE POLYP REMOVAL  SURGEON:  Jerene Bears  ASSISTANTS: OR staff.    ANESTHESIA:   general  ESTIMATED BLOOD LOSS: 5 mL  BLOOD ADMINISTERED:none   FLUIDS: 1000cc LR  UOP: voided before going back to the OR  SPECIMEN:  endometrial curetting  DISPOSITION OF SPECIMEN:  PATHOLOGY  FINDINGS: thin endometrium, no lesions in the uterus or cervix  DESCRIPTION OF OPERATION: Patient was taken to the operating room.  She is placed in the supine position. SCDs were on her lower extremities and functioning properly. General anesthesia with an LMA was administered without difficulty. Dr. Stephannie Peters, anesthesia, oversaw case.  Legs were then placed in the [facility] stirrups in the low lithotomy position. The legs were lifted to the high lithotomy position and the Betadine prep was used on the inner thighs perineum and vagina x3. Patient was draped in a normal standard fashion.  A bivalve speculum was placed the vagina. The anterior lip of the cervix was grasped with single-tooth tenaculum.  A paracervical block of 1% lidocaine mixed one-to-one with epinephrine (1:100,000 units).  10 cc was used total. The cervix was stenotic.  Os finder initially used.  The endometrial cavity sounded to 5 cm.  Pratt dilators were used and cervix dilated to #15.  Then a 3.58mm hysteroscope diagnostic hysteroscope was obtained. Normal saline was used as a hysteroscopic fluid. The hysteroscope was advanced through the endocervical canal into the endometrial cavity.  Visualization was not adequate as the fluid was slightly bloody, so cervix was dilated with half hagar dilators up to #5.5.  Then a 5.35mm hysteroscope was passed. The tubal ostia were noted bilaterally.  Atrophic endometrium was noted and no lesions were present.  The hysteroscope was removed.  A #1 toothed curette was too large to pass so an endometrial pipelle was passed. Suction applied.  This was used to curette the endometrium and this was passed several times to get adequate tissue.  At this point no other procedure was needed and this procedure was ended. The hysteroscope was removed. The fluid deficit was 80 cc. The tenaculum was removed from the anterior lip of the cervix. The speculum was removed from the vagina. The prep was cleansed of the patient's skin. The legs are positioned back in the supine position. Sponge, lap, needle, instrument counts were correct x2. Patient was taken to recovery in stable condition.   COUNTS:  YES  PLAN OF CARE: Transfer to PACU

## 2023-06-11 NOTE — Transfer of Care (Signed)
 Immediate Anesthesia Transfer of Care Note  Patient: Allison Cruz  Procedure(s) Performed: DILATATION & CURETTAGE/HYSTEROSCOPY WITH MYOSURE POSSIBLE POLYP REMOVAL  Patient Location: PACU  Anesthesia Type:General  Level of Consciousness: awake  Airway & Oxygen Therapy: Patient Spontanous Breathing  Post-op Assessment: Report given to RN  Post vital signs: Reviewed  Last Vitals:  Vitals Value Taken Time  BP 96/56 06/11/23 1330  Temp 36.4 C 06/11/23 1330  Pulse 77 06/11/23 1334  Resp 16 06/11/23 1334  SpO2 100 % 06/11/23 1334  Vitals shown include unfiled device data.  Last Pain:  Vitals:   06/11/23 1330  TempSrc:   PainSc: 6       Patients Stated Pain Goal: 4 (06/11/23 0901)  Complications: No notable events documented.

## 2023-06-11 NOTE — Anesthesia Procedure Notes (Signed)
 Procedure Name: LMA Insertion Date/Time: 06/11/2023 12:36 PM  Performed by: Cy Blamer, CRNAPre-anesthesia Checklist: Patient identified, Emergency Drugs available, Suction available, Patient being monitored and Timeout performed Patient Re-evaluated:Patient Re-evaluated prior to induction Oxygen Delivery Method: Circle system utilized Preoxygenation: Pre-oxygenation with 100% oxygen Induction Type: IV induction LMA: LMA inserted LMA Size: 4.0 Placement Confirmation: positive ETCO2 and breath sounds checked- equal and bilateral Tube secured with: Tape Dental Injury: Teeth and Oropharynx as per pre-operative assessment

## 2023-06-11 NOTE — H&P (Signed)
 Allison Cruz is an 68 y.o. female G0 SWF with hx of recurrent PMP bleeding here for hysteroscopy with possible polyp removal, possible D&C for additional evaluation and to ensure no abnormal pathology present.  Procedure, risks and benefits have been discussed.  Questions answered today.  She is with her niece today.    Pertinent Gynecological History: Menses: post-menopausal Bleeding: post menopausal bleeding Contraception: none DES exposure: denies Blood transfusions: none Sexually transmitted diseases: no past history Previous GYN Procedures:  none   Last mammogram: normal Date: 01/2023 Last pap: normal Date: 09/11/2022 OB History: G0, P0   Menstrual History: Patient's last menstrual period was 12/31/2008.    Past Medical History:  Diagnosis Date   Anxiety    Basal cell carcinoma of face    forehead   CFS (chronic fatigue syndrome)    Chronic fatigue 08/01/2017   Chronic insomnia 01/20/2016   Common migraine with intractable migraine 01/20/2016   Dense breasts 04/15/2014   Depression    Diverticulosis 08/01/2017   Family history of breast cancer in first degree relative 04/15/2014   Fibromyalgia    Gallstones    Generalized anxiety disorder 08/31/2015   GERD (gastroesophageal reflux disease) 08/01/2017   Hematuria    neg eval Dr. Etta Grandchild, 2000   Hypertension    MDD (major depressive disorder), recurrent episode, severe (HCC) 04/08/2013   Migraine    Osteoporosis 10/05/2014   PONV (postoperative nausea and vomiting)    Renal cyst    Sciatica    bulging L5-S1   Tachycardia    negative stress test    Past Surgical History:  Procedure Laterality Date   MOHS SURGERY  9/08   Dr Deanna Artis    Family History  Problem Relation Age of Onset   Colon cancer Father 26           Depression Father    Breast cancer Mother 59       her 2 neu+   Hypertension Mother    Heart Problems Mother        pacemaker placed   Heart disease Maternal Grandmother    Heart disease  Paternal Grandmother    Breast cancer Maternal Aunt        maternal great aunt   Dementia Maternal Aunt     Social History:  reports that she has never smoked. She has never used smokeless tobacco. She reports that she does not drink alcohol and does not use drugs.  Allergies:  Allergies  Allergen Reactions   Dramamine Ii [Meclizine Hcl]     Blisters   Erythromycin Nausea And Vomiting    Makes Pt sick    Levofloxacin      GI Upset (intolerance)   Meclizine Hives   Sulfa Antibiotics Nausea And Vomiting   Dimenhydrinate Rash    Medications Prior to Admission  Medication Sig Dispense Refill Last Dose/Taking   ascorbic acid (VITAMIN C) 500 MG tablet Take 500 mg by mouth daily.   06/10/2023   B Complex Vitamins (VITAMIN B COMPLEX PO) Take 1 tablet by mouth daily.   06/10/2023   bisoprolol (ZEBETA) 5 MG tablet Take 2.5 mg by mouth daily.   3 06/11/2023 at  7:00 AM   brexpiprazole (REXULTI) 1 MG TABS tablet Take 1 mg by mouth daily.   06/10/2023   Calcium Carbonate (CALCIUM 600 PO) Take 600 mg by mouth 3 (three) times daily.   06/10/2023   Cholecalciferol (VITAMIN D3) 125 MCG (5000 UT) CAPS Take 5,000 Units by  mouth daily.   06/10/2023   clonazePAM (KLONOPIN) 1 MG tablet Take 1 mg by mouth See admin instructions. Take 1 mg in the day as needed  and 1mg  at bedtime   06/11/2023 at  7:00 AM   Coenzyme Q10 (CO Q 10) 100 MG CAPS Take 100 mg by mouth daily.   06/10/2023   desvenlafaxine (PRISTIQ) 100 MG 24 hr tablet Take 100 mg by mouth daily.   06/10/2023   fish oil-omega-3 fatty acids 1000 MG capsule Take 2 g by mouth daily.   06/10/2023   FLUoxetine (PROZAC) 20 MG capsule Take 20 mg by mouth daily.  1 06/11/2023 at  7:00 AM   KETAMINE HCL IV Inject into the vein. Every 3 weeks  Dr. Alexia Freestone   06/07/2023   magnesium oxide (MAG-OX) 400 (240 Mg) MG tablet Take 800 mg by mouth daily.   06/10/2023   MILK THISTLE PO Take 1 capsule by mouth daily.   06/10/2023   Multiple Vitamins-Minerals (ICAPS AREDS 2  PO) Take 1 capsule by mouth 2 (two) times daily.   06/10/2023   Multiple Vitamins-Minerals (MULTIVITAMIN WITH MINERALS) tablet Take 1 tablet by mouth daily.   06/10/2023   Probiotic Product (PROBIOTIC DAILY) CAPS Take 1 capsule by mouth daily.   06/10/2023   traZODone (DESYREL) 50 MG tablet Take 100 mg by mouth at bedtime.   06/10/2023 Evening   promethazine (PHENERGAN) 25 MG tablet Take 1 tablet (25 mg total) by mouth every 8 (eight) hours as needed. 30 tablet 1 Unknown   rizatriptan (MAXALT) 10 MG tablet Take 10 mg by mouth as needed for migraine.   Unknown    Review of Systems  Constitutional: Negative.   Genitourinary:        PMP bleeding    Blood pressure 124/81, pulse (!) 105, temperature 97.7 F (36.5 C), resp. rate 19, height 5\' 4"  (1.626 m), weight 70.3 kg, last menstrual period 12/31/2008, SpO2 100%. Physical Exam Constitutional:      Appearance: Normal appearance.  Cardiovascular:     Rate and Rhythm: Normal rate and regular rhythm.  Pulmonary:     Effort: Pulmonary effort is normal.     Breath sounds: Normal breath sounds.  Neurological:     General: No focal deficit present.  Psychiatric:        Behavior: Behavior normal.        Thought Content: Thought content normal.     Results for orders placed or performed during the hospital encounter of 06/11/23 (from the past 24 hours)  Basic metabolic panel per protocol     Status: None   Collection Time: 06/11/23  9:18 AM  Result Value Ref Range   Sodium 138 135 - 145 mmol/L   Potassium 4.2 3.5 - 5.1 mmol/L   Chloride 107 98 - 111 mmol/L   CO2 23 22 - 32 mmol/L   Glucose, Bld 97 70 - 99 mg/dL   BUN 16 8 - 23 mg/dL   Creatinine, Ser 1.61 0.44 - 1.00 mg/dL   Calcium 9.0 8.9 - 09.6 mg/dL   GFR, Estimated >04 >54 mL/min   Anion gap 8 5 - 15  CBC     Status: None   Collection Time: 06/11/23  9:18 AM  Result Value Ref Range   WBC 7.6 4.0 - 10.5 K/uL   RBC 3.98 3.87 - 5.11 MIL/uL   Hemoglobin 13.0 12.0 - 15.0 g/dL   HCT  09.8 11.9 - 14.7 %   MCV 94.7  80.0 - 100.0 fL   MCH 32.7 26.0 - 34.0 pg   MCHC 34.5 30.0 - 36.0 g/dL   RDW 16.1 09.6 - 04.5 %   Platelets 268 150 - 400 K/uL   nRBC 0.0 0.0 - 0.2 %    No results found.  Assessment/Plan: 68 yo G0 SWF with recurrent PMP bleeding who has undergone office evaluation with ultrasounds and endometrial biopsies.  Due to continued bleeding, hysteroscopy recommended.  Questions answered. We have previously reviewed risks.  She is ready to proceed.  Jerene Bears 06/11/2023, 12:16 PM

## 2023-06-12 ENCOUNTER — Encounter (HOSPITAL_COMMUNITY): Payer: Self-pay | Admitting: Obstetrics & Gynecology

## 2023-06-13 LAB — SURGICAL PATHOLOGY

## 2023-06-17 ENCOUNTER — Encounter (HOSPITAL_BASED_OUTPATIENT_CLINIC_OR_DEPARTMENT_OTHER): Payer: Self-pay | Admitting: Obstetrics & Gynecology

## 2023-07-11 ENCOUNTER — Encounter (HOSPITAL_BASED_OUTPATIENT_CLINIC_OR_DEPARTMENT_OTHER): Payer: Self-pay | Admitting: Obstetrics & Gynecology

## 2023-07-11 ENCOUNTER — Ambulatory Visit (HOSPITAL_BASED_OUTPATIENT_CLINIC_OR_DEPARTMENT_OTHER): Payer: Self-pay | Admitting: Obstetrics & Gynecology

## 2023-07-11 VITALS — BP 105/74 | HR 69 | Ht 64.0 in | Wt 159.4 lb

## 2023-07-11 DIAGNOSIS — N952 Postmenopausal atrophic vaginitis: Secondary | ICD-10-CM | POA: Diagnosis not present

## 2023-07-11 DIAGNOSIS — N950 Postmenopausal bleeding: Secondary | ICD-10-CM

## 2023-07-11 MED ORDER — REVAREE PLUS 0.5 % VA SUPP: 1.0000 | VAGINAL | Status: AC

## 2023-07-11 NOTE — Progress Notes (Signed)
 GYNECOLOGY  VISIT  CC:   post op recheck  HPI: 68 y.o. G0P0 Single White or Caucasian female here for recheck after undergoing hysteroscopy with endometrial sampling on 06/11/2023.  Pictures reviewed.  Pathology and findings discussed.  Had some spotting after surgery for a few days and then just this past weekend.  Not heavy.  She has no pain.  Bowel function is Normal.  Bladder function is normal.  Vaginal atrophy present.  Treatment options discussed.  Will start revaree.   MEDS:   Current Outpatient Medications on File Prior to Visit  Medication Sig Dispense Refill   ascorbic acid (VITAMIN C) 500 MG tablet Take 500 mg by mouth daily.     B Complex Vitamins (VITAMIN B COMPLEX PO) Take 1 tablet by mouth daily.     bisoprolol (ZEBETA) 5 MG tablet Take 2.5 mg by mouth daily.   3   brexpiprazole (REXULTI) 1 MG TABS tablet Take 1 mg by mouth daily.     Calcium Carbonate (CALCIUM 600 PO) Take 600 mg by mouth 3 (three) times daily.     Cholecalciferol (VITAMIN D3) 125 MCG (5000 UT) CAPS Take 5,000 Units by mouth daily.     clonazePAM (KLONOPIN) 1 MG tablet Take 1 mg by mouth See admin instructions. Take 1 mg in the day as needed  and 1mg  at bedtime     Coenzyme Q10 (CO Q 10) 100 MG CAPS Take 100 mg by mouth daily.     desvenlafaxine (PRISTIQ) 100 MG 24 hr tablet Take 100 mg by mouth daily.     fish oil-omega-3 fatty acids 1000 MG capsule Take 2 g by mouth daily.     FLUoxetine (PROZAC) 20 MG capsule Take 20 mg by mouth daily.  1   HYDROcodone-acetaminophen (NORCO/VICODIN) 5-325 MG tablet Take 1-2 tablets by mouth every 6 (six) hours as needed for moderate pain (pain score 4-6) or severe pain (pain score 7-10). 12 tablet 0   KETAMINE HCL IV Inject into the vein. Every 3 weeks  Dr. Alexia Freestone     magnesium oxide (MAG-OX) 400 (240 Mg) MG tablet Take 800 mg by mouth daily.     MILK THISTLE PO Take 1 capsule by mouth daily.     Multiple Vitamins-Minerals (ICAPS AREDS 2 PO) Take 1 capsule by mouth  2 (two) times daily.     Multiple Vitamins-Minerals (MULTIVITAMIN WITH MINERALS) tablet Take 1 tablet by mouth daily.     Probiotic Product (PROBIOTIC DAILY) CAPS Take 1 capsule by mouth daily.     promethazine (PHENERGAN) 25 MG tablet Take 1 tablet (25 mg total) by mouth every 8 (eight) hours as needed. 30 tablet 1   rizatriptan (MAXALT) 10 MG tablet Take 10 mg by mouth as needed for migraine.     traZODone (DESYREL) 50 MG tablet Take 100 mg by mouth at bedtime.     No current facility-administered medications on file prior to visit.    SH:  Smoking No    PHYSICAL EXAMINATION:    BP 105/74 (BP Location: Right Arm, Patient Position: Sitting, Cuff Size: Normal)   Pulse 69   Ht 5\' 4"  (1.626 m)   Wt 159 lb 6.4 oz (72.3 kg)   LMP 12/31/2008   BMI 27.36 kg/m     General appearance: alert, cooperative and appears stated age  Pelvic: External genitalia:  no lesions              Urethra:  normal appearing urethra with no masses,  tenderness or lesions              Bartholins and Skenes: normal                 Vagina: atrophic mucosa              Cervix: no lesions              Bimanual Exam:  Uterus:  normal size, contour, position, consistency, mobility, non-tender              Assessment/Plan: 1. Postmenopausal bleeding (Primary) - pathology negative and no lesions present at time of hysteroscopy  2. Vaginal atrophy - will start Revaree vaginal suppositories twice weekly.  Samples x 1 month given.  Could also consider Vit D vaginal cream.   - if works well, information for how to order this given to pt - has follow up appt in July

## 2023-10-11 ENCOUNTER — Ambulatory Visit (INDEPENDENT_AMBULATORY_CARE_PROVIDER_SITE_OTHER): Payer: Medicare PPO | Admitting: Obstetrics & Gynecology

## 2023-10-11 ENCOUNTER — Encounter (HOSPITAL_BASED_OUTPATIENT_CLINIC_OR_DEPARTMENT_OTHER): Payer: Self-pay | Admitting: Obstetrics & Gynecology

## 2023-10-11 VITALS — BP 105/75 | HR 79 | Ht 64.0 in | Wt 155.4 lb

## 2023-10-11 DIAGNOSIS — N952 Postmenopausal atrophic vaginitis: Secondary | ICD-10-CM | POA: Diagnosis not present

## 2023-10-11 DIAGNOSIS — M810 Age-related osteoporosis without current pathological fracture: Secondary | ICD-10-CM

## 2023-10-11 DIAGNOSIS — Z0184 Encounter for antibody response examination: Secondary | ICD-10-CM

## 2023-10-11 DIAGNOSIS — Z01411 Encounter for gynecological examination (general) (routine) with abnormal findings: Secondary | ICD-10-CM | POA: Diagnosis not present

## 2023-10-11 DIAGNOSIS — Z9189 Other specified personal risk factors, not elsewhere classified: Secondary | ICD-10-CM

## 2023-10-11 DIAGNOSIS — Z1239 Encounter for other screening for malignant neoplasm of breast: Secondary | ICD-10-CM

## 2023-10-11 DIAGNOSIS — Z01419 Encounter for gynecological examination (general) (routine) without abnormal findings: Secondary | ICD-10-CM

## 2023-10-11 MED ORDER — NONFORMULARY OR COMPOUNDED ITEM: 3 refills | Status: DC

## 2023-10-11 NOTE — Patient Instructions (Signed)
 Dr. Leita Blind [address] [place], [place] [address] P: [phone] F: [phone]  [email or web address]

## 2023-10-11 NOTE — Progress Notes (Signed)
 Breast and Pelvic exam Patient name: Allison Cruz MRN [id number]  Date of birth: Mar 09, 1956 Chief Complaint:   Breast & Pelvic exam  (No concerns)  History of Present Illness:   Allison Cruz is a 68 y.o. G0P0 Caucasian female being seen today for breast and pelvic exam.  Her mother continues to decline.  In [place] a lot with her mother who is bed bound.  They have 24 hour care for her and hospice.    Denies vaginal bleeding  She has questions about measles immunity.  She was born in [place] so documentation recommended unless is sure about vaccination status which she is not.    Patient's last menstrual period was 12/31/2008.  Last pap 2024 negative.  Last mammogram: 01/2024. Results were: normal. Family h/o breast cancer: yes mother Last colonoscopy: 2024 per pt.  5 year follow up recommended.  Colonoscopy was negative.   DEXA:  early osteoporosis.  Declined treatment.  Will repeat next year.      03/20/2023   11:34 AM 03/13/2023    9:19 AM 11/21/2022    1:04 PM 09/11/2022   11:40 AM 02/19/2022   11:39 AM  Depression screen PHQ 2/9  Decreased Interest 0 0 0 0 0  Down, Depressed, Hopeless 0 0 0 0 0  PHQ - 2 Score 0 0 0 0 0    Review of Systems:   Pertinent items are noted in HPI Denies any bladder or bowel changes.  Denies pelvic pain.l   Pertinent History Reviewed:  Reviewed past medical,surgical, social and family history.  Reviewed problem list, medications and allergies. Physical Assessment:   Vitals:   10/11/23 0922  BP: 105/75  Pulse: 79  Weight: 155 lb 6.4 oz (70.5 kg)  Height: 5' 4 (1.626 m)  Body mass index is 26.67 kg/m.        Physical Examination:   General appearance - well appearing, and in no distress  Mental status - alert, oriented to person, place, and time  Psych:  She has a normal mood and affect  Skin - warm and dry, normal color, no suspicious lesions noted  Chest - effort normal, all lung fields clear to auscultation  bilaterally  Heart - normal rate and regular rhythm  Neck:  midline trachea, no thyromegaly or nodules  Breasts - breasts appear normal, no suspicious masses, no skin or nipple changes or  axillary nodes  Abdomen - soft, nontender, nondistended, no masses or organomegaly  Pelvic - VULVA: normal appearing vulva with no masses, tenderness or lesions   VAGINA: normal appearing vagina with normal color and discharge, no lesions   CERVIX: normal appearing cervix without discharge or lesions, no CMT  Thin prep pap is not done   UTERUS: uterus is felt to be normal size, shape, consistency and nontender   ADNEXA: No adnexal masses or tenderness noted.  Rectal - normal rectal, good sphincter tone, no masses felt.   Extremities:  No swelling or varicosities noted  Chaperone present for exam   Assessment & Plan:  1. Encntr for gyn exam (general) (routine) w/o abn findings (Primary) - Pap smear 2024 - Mammogram 01/2023 - Colonoscopy 2024 - Bone mineral density 2024 - lab work done with PCP, Dr. Jakie - vaccines reviewed/updated  2. Breast cancer screening, high risk patient - MR BREAST BILATERAL W WO CONTRAST INC CAD; Future  3. Age-related osteoporosis without current pathological fracture - will repeat DEXA next year.  Taking Vit D OTC.  4. Increased  risk of breast cancer - will recheck breast exam in 6 months  5. Vaginal atrophy - NONFORMULARY OR COMPOUNDED ITEM; Vitamin E vaginal cream 200u/ml.  One ml vaginally three times weekly.  If can be dispensed in individual syringes, please make this way.  Dispense: 36 each; Refill: 3  6. Immunity status testing - Measles ([place]) Antibody IgG    Orders Placed This Encounter  Procedures   MR BREAST BILATERAL W WO CONTRAST INC CAD   Measles ([place]) Antibody IgG    Meds:  Meds ordered this encounter  Medications   NONFORMULARY OR COMPOUNDED ITEM    Sig: Vitamin E vaginal cream 200u/ml.  One ml vaginally three times weekly.   If can be dispensed in individual syringes, please make this way.    Dispense:  36 each    Refill:  3    Follow-up: Return in about 6 months (around 04/12/2024) for breast check and then 1 year breast/pelvic exam.  Ronal GORMAN Pinal, MD 10/11/2023 10:05 AM

## 2023-10-12 ENCOUNTER — Ambulatory Visit (HOSPITAL_BASED_OUTPATIENT_CLINIC_OR_DEPARTMENT_OTHER): Payer: Self-pay | Admitting: Obstetrics & Gynecology

## 2023-10-12 LAB — RUBEOLA ANTIBODY IGG: RUBEOLA AB, IGG: >300 [AU]/ml (ref >16.4)

## 2023-10-25 ENCOUNTER — Encounter (HOSPITAL_BASED_OUTPATIENT_CLINIC_OR_DEPARTMENT_OTHER): Payer: Self-pay | Admitting: Obstetrics & Gynecology

## 2023-10-28 ENCOUNTER — Encounter (HOSPITAL_BASED_OUTPATIENT_CLINIC_OR_DEPARTMENT_OTHER): Payer: Self-pay | Admitting: Obstetrics & Gynecology

## 2023-10-28 ENCOUNTER — Other Ambulatory Visit (HOSPITAL_BASED_OUTPATIENT_CLINIC_OR_DEPARTMENT_OTHER): Payer: Self-pay | Admitting: Obstetrics & Gynecology

## 2023-10-28 DIAGNOSIS — N952 Postmenopausal atrophic vaginitis: Secondary | ICD-10-CM

## 2023-10-28 MED ORDER — NONFORMULARY OR COMPOUNDED ITEM: 3 refills | Status: AC

## 2023-11-09 ENCOUNTER — Ambulatory Visit
Admission: RE | Admit: 2023-11-09 | Discharge: 2023-11-09 | Disposition: A | Discharge status: Home / Self Care | Source: Ambulatory Visit | Attending: Obstetrics & Gynecology | Admitting: Obstetrics & Gynecology

## 2023-11-09 DIAGNOSIS — Z1239 Encounter for other screening for malignant neoplasm of breast: Secondary | ICD-10-CM

## 2023-11-09 MED ORDER — GADOPICLENOL 0.5 MMOL/ML IV SOLN
7.0000 mL | Freq: Once | INTRAVENOUS | Status: AC | PRN
  Administered 2023-11-09: 7 mL via INTRAVENOUS

## 2023-11-15 IMAGING — NM NM BONE WHOLE BODY
2 series · 2 of 2 positions shown · non-contrast
Comparison: MRI breast 08/09/2021.

CLINICAL DATA: Sternal lesion on MRI.

EXAM:
NUCLEAR MEDICINE WHOLE BODY BONE SCAN
TECHNIQUE: Whole body anterior and posterior images were obtained approximately
3 hours after intravenous injection of radiopharmaceutical.
RADIOPHARMACEUTICALS:  21.0 mCi Vechnetium-JJm MDP IV

[Series 1: wbr_bone_60 whole body · 2.66mm/px · 1 of 1 slices shown (1 of 2)]
[im 1/1  full-range]
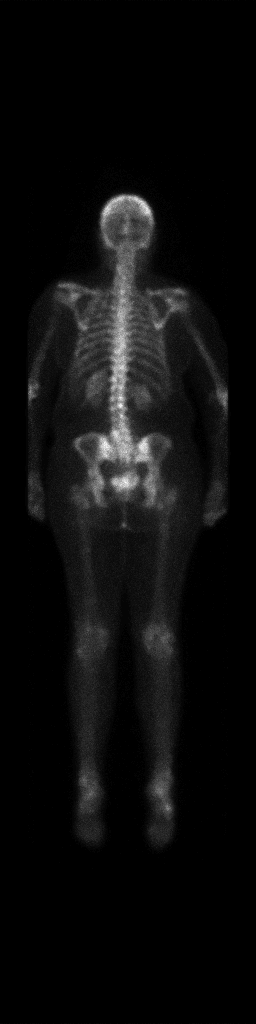

[Series 1: wbr_bone_60 whole body · 2.66mm/px · 1 of 1 slices shown (2 of 2)]
[im 1/1]
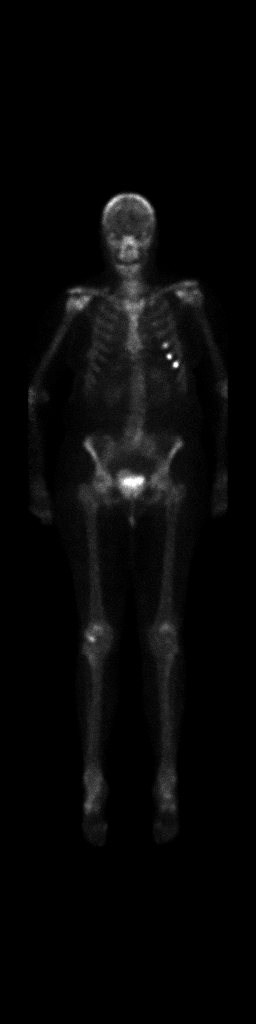

[2 of 2 positions shown; findings below may reference images not displayed]

FINDINGS: Bilateral renal function and excretion. Left anterior fifth, sixth,
seventh rib focal areas of increased activity in a stepladder
fashion. These are most consistent with rib fractures. Left rib
series can be obtained for further evaluation. A faint miniscule
focus of increased activity noted over the left sternum. This could
represent the area of increased enhancement in the sternum on MRI of
the breast study of 08/09/2021. CT of the sternum can be obtained
for further evaluation. Mild increased activity noted over both
shoulders, knees, and ankles most consistent with degenerative
change. Mild multifocal areas of increased activity noted throughout
the spine and both SI joints, most likely degenerative.
IMPRESSION: 1. Left anterior fifth, sixth, seventh rib focal areas of increased
activity in a stepladder fashion. These are most consistent with rib
fractures. Left rib series can be obtained for further evaluation.

2. A faint miniscule focus of increased activity noted over the left
sternum. This could represent area of increased enhancement in the
sternum on MRI of the breast study of 08/09/2021. CT of the sternum
can be obtained for further evaluation.

3. Multifocal areas of mild increased activity noted about both
shoulders, knees, ankles, throughout the spine, and both SI joints.
These findings are most likely degenerative.

## 2023-11-19 NOTE — Telephone Encounter (Signed)
-----   Message from Ronal GORMAN Pinal sent at 11/18/2023  7:09 PM EDT ----- Pt has not reviewed results.  Please let her know her breat MRI was negative for abnormal findings.  Thanks. ----- Message ----- From: Interface, Rad Results In Sent: 11/11/2023  11:49 AM EDT To: Ronal GORMAN Pinal, MD

## 2023-11-19 NOTE — Telephone Encounter (Signed)
Spoke with patient. Advised of results as seen below from [place]. Patient verbalizes understanding.

## 2024-01-22 ENCOUNTER — Encounter (HOSPITAL_BASED_OUTPATIENT_CLINIC_OR_DEPARTMENT_OTHER): Payer: Self-pay | Admitting: Obstetrics & Gynecology

## 2024-04-13 ENCOUNTER — Ambulatory Visit (HOSPITAL_BASED_OUTPATIENT_CLINIC_OR_DEPARTMENT_OTHER): Admitting: Obstetrics & Gynecology

## 2024-04-13 ENCOUNTER — Encounter (HOSPITAL_BASED_OUTPATIENT_CLINIC_OR_DEPARTMENT_OTHER): Payer: Self-pay | Admitting: Obstetrics & Gynecology

## 2024-04-13 VITALS — BP 113/70 | HR 83 | Ht 64.0 in | Wt 162.2 lb

## 2024-04-13 DIAGNOSIS — Z803 Family history of malignant neoplasm of breast: Secondary | ICD-10-CM | POA: Diagnosis not present

## 2024-04-13 DIAGNOSIS — Z9189 Other specified personal risk factors, not elsewhere classified: Secondary | ICD-10-CM

## 2024-04-13 NOTE — Progress Notes (Unsigned)
" ° °  GYNECOLOGY  VISIT  CC:   No chief complaint on file.   HPI: 69 y.o. G0P0 Single White or Caucasian female here for breast exam. Patient reports no issues or concerns today. Had Breast MRI in August that was negative.  Patient's last menstrual period was 12/31/2008.  Past Medical History:  Diagnosis Date   Anxiety    Basal cell carcinoma of face    forehead   CFS (chronic fatigue syndrome)    Chronic fatigue 08/01/2017   Chronic insomnia 01/20/2016   Common migraine with intractable migraine 01/20/2016   Dense breasts 04/15/2014   Depression    Diverticulosis 08/01/2017   Family history of breast cancer in first degree relative 04/15/2014   Fibromyalgia    Gallstones    Generalized anxiety disorder 08/31/2015   GERD (gastroesophageal reflux disease) 08/01/2017   Hematuria    neg eval Dr. Kathlynn, 2000   Hypertension    MDD (major depressive disorder), recurrent episode, severe (HCC) 04/08/2013   Migraine    Osteoporosis 10/05/2014   PONV (postoperative nausea and vomiting)    Renal cyst    Sciatica    bulging L5-S1   Tachycardia    negative stress test    MEDS:  Reviewed in EPIC  ALLERGIES: Dramamine ii [meclizine hcl], Erythromycin, Levofloxacin, Meclizine, Sulfa antibiotics, and Dimenhydrinate  SH:  ***  ROS  PHYSICAL EXAMINATION:    LMP 12/31/2008     General appearance: alert, cooperative and appears stated age Neck: no adenopathy, supple, symmetrical, trachea midline and thyroid {CHL AMB PHY EX THYROID NORM DEFAULT:[phone]::normal to inspection and palpation} CV:  {Exam; heart brief:31539} Lungs:  {pe lungs ob:314451} Breasts: {Exam; breast:13139::normal appearance, no masses or tenderness} Abdomen: soft, non-tender; bowel sounds normal; no masses,  no organomegaly Lymph:  no inguinal LAD noted  Pelvic: External genitalia:  no lesions              Urethra:  normal appearing urethra with no masses, tenderness or lesions              Bartholins  and Skenes: normal                 Vagina: {exam; pelvic vaginal:30846}              Cervix: {CHL AMB PHY EX CERVIX NORM DEFAULT:[phone]::no lesions}              Bimanual Exam:  Uterus:  {CHL AMB PHY EX UTERUS NORM [phone]::normal size, contour, position, consistency, mobility, non-tender}              Adnexa: {CHL AMB PHY EX ADNEXA NO MASS [phone]::no mass, fullness, tenderness}              Rectovaginal: {yes no:[id number]}.  Confirms.              Anus:  normal sphincter tone, no lesions  Chaperone was present for exam.  Assessment/Plan: There are no diagnoses linked to this encounter.  "

## 2024-10-14 ENCOUNTER — Encounter (HOSPITAL_BASED_OUTPATIENT_CLINIC_OR_DEPARTMENT_OTHER): Payer: Self-pay | Admitting: Obstetrics & Gynecology
